# Patient Record
Sex: Male | Born: 1975 | Race: White | Hispanic: No | Marital: Single | Smoking: Former smoker
Health system: Southern US, Community
[De-identification: ages and names within clinical notes are randomized; demographics above are authoritative.]

## PROBLEM LIST (undated history)

## (undated) DIAGNOSIS — I509 Heart failure, unspecified: Secondary | ICD-10-CM

## (undated) DIAGNOSIS — I10 Essential (primary) hypertension: Secondary | ICD-10-CM

## (undated) HISTORY — PX: APPENDECTOMY: SHX54

## (undated) HISTORY — DX: Essential (primary) hypertension: I10

## (undated) HISTORY — DX: Heart failure, unspecified: I50.9

---

## 2006-11-29 ENCOUNTER — Emergency Department (HOSPITAL_COMMUNITY): Admission: EM | Admit: 2006-11-29 | Discharge: 2006-11-29 | Payer: Self-pay | Admitting: Emergency Medicine

## 2008-06-22 ENCOUNTER — Emergency Department (HOSPITAL_COMMUNITY): Admission: EM | Admit: 2008-06-22 | Discharge: 2008-06-22 | Payer: Self-pay | Admitting: Emergency Medicine

## 2008-06-26 ENCOUNTER — Inpatient Hospital Stay (HOSPITAL_COMMUNITY): Admission: EM | Admit: 2008-06-26 | Discharge: 2008-06-29 | Payer: Self-pay | Admitting: Family Medicine

## 2008-06-27 ENCOUNTER — Encounter (INDEPENDENT_AMBULATORY_CARE_PROVIDER_SITE_OTHER): Payer: Self-pay | Admitting: General Surgery

## 2009-09-29 ENCOUNTER — Emergency Department (HOSPITAL_COMMUNITY): Admission: EM | Admit: 2009-09-29 | Discharge: 2009-09-29 | Payer: Self-pay | Admitting: Emergency Medicine

## 2010-11-01 LAB — CBC
MCHC: 35 g/dL (ref 30.0–36.0)
RDW: 12.6 % (ref 11.5–15.5)
WBC: 9 10*3/uL (ref 4.0–10.5)

## 2010-11-01 LAB — COMPREHENSIVE METABOLIC PANEL
ALT: 415 U/L — ABNORMAL HIGH (ref 0–53)
Albumin: 4.1 g/dL (ref 3.5–5.2)
CO2: 26 mEq/L (ref 19–32)
Chloride: 102 mEq/L (ref 96–112)
Creatinine, Ser: 0.97 mg/dL (ref 0.4–1.5)
GFR calc Af Amer: >60 mL/min (ref >60)
Glucose, Bld: 116 mg/dL — ABNORMAL HIGH (ref 70–99)
Potassium: 4.2 mEq/L (ref 3.5–5.1)
Sodium: 135 mEq/L (ref 135–145)
Total Bilirubin: 1.6 mg/dL — ABNORMAL HIGH (ref 0.3–1.2)
Total Protein: 7.5 g/dL (ref 6.0–8.3)

## 2010-11-01 LAB — DIFFERENTIAL
Basophils Relative: 0 % (ref 0–1)
Eosinophils Relative: 0 % (ref 0–5)
Neutro Abs: 6.6 10*3/uL (ref 1.7–7.7)

## 2010-11-01 LAB — LIPASE, BLOOD: Lipase: 28 U/L (ref 11–59)

## 2010-12-25 NOTE — Discharge Summary (Signed)
NAMEBERTIN, INABINET NO.:  [id number]   MEDICAL RECORD NO.:  [id number]          PATIENT TYPE:  INP   LOCATION:  5151                         FACILITY:  [facility]   PHYSICIAN:  Velora Heckler, MD      DATE OF BIRTH:  10/07/1975   DATE OF ADMISSION:  06/26/2008  DATE OF DISCHARGE:  06/29/2008                               DISCHARGE SUMMARY   DISCHARGING PHYSICIAN:  Velora Heckler, MD.   PROCEDURES:  Laparoscopic appendectomy with placement of right lower  quadrant Blake drain by Dr. Lindie Spruce on June 26, 2008.   CONSULTANTS:  There were none.   REASON FOR ADMISSION:  Matthew Warren is a 35 year old white male who  presented to the emergency department with a 7-day history of right  lower quadrant abdominal pain.  At the time of admission, a CT scan was  performed, which showed acute appendicitis.  At this time, his white  blood cell count was 11,400.  At this time, he was taken to the  operating room for surgical intervention.  Please see admitting history  and physical for further details.   ADMITTING DIAGNOSIS:  Acute appendicitis.   HOSPITAL COURSE:  At this time, the patient was admitted and placed on  Unasyn.  He was then taken to the operating room where laparoscopic  appendectomy with placement of the right lower quadrant Blake drain was  performed.  At the time of surgical intervention, it was found that the  patient's appendix had ruptured and at this time was gangrenous.  This  was fairly difficult to remove, however, was removed without  complication, and a Blake drain was placed to help drain any left over  fluid as well as if the patient develops an abscess or a leak at his  appendiceal stump.  The patient tolerated this procedure well.  After  the patient was brought back to the floor, he was then placed on  gentamicin, Flagyl, and Unasyn.  It appears for his antibiotics.  By  postoperative day one-half, the patient was complaining of abdominal  pain mostly with urination, otherwise at this time, he was tolerating  clear liquids.  On exam, his abdomen was soft , diffusely tender with  some active bowel sounds, and his JP drain was draining serosanguineous  fluid.  At this time, he was left on clear liquids and continued on his  gentamicin.  By postoperative day 1, he was doing better and at this  time, his diet was advanced to a full-liquid diet.  Once again on exam,  his abdomen was still diffusely tender, mainly in the right lower  quadrant and his JP still has serosanguineous output.  By postoperative  day 2, the patient was feeling much better as he was placed on Toradol  the day prior.  At this time, he had an appetite and was ready to try a  regular diet.  Also at this time, he was tolerating p.o. pain medicines  with no problem.  On exam, his abdomen was soft, still tender, however,  much less tender.  His incisions were all clean, dry, and  intact, and  his JP drain had decreasing output that was still serosanguineous.  At  this time, his diet was advanced.  His IV antibiotics were discontinued,  and he was started on p.o. Augmentin.  At this time, it was felt that as  long as he tolerated his regular diet, then he may be discharged home  after lunch.   DISCHARGE DIAGNOSES:  1. Gangrenous, ruptured appendicitis.  2. Status post laparoscopic appendectomy with placement of right lower      quadrant Blake drain.   DISCHARGE MEDICATIONS:  1. He does not take any home medications, but he was given      prescription for Augmentin 875 mg one p.o. b.i.d. for 7 days.  2. Vicodin 5/325 one to two tablets q.4 h. p.r.n. pain.  3. He was also advised to take any over-the-counter stool softener as      needed for constipation.   DISCHARGE INSTRUCTIONS:  Matthew Warren was informed that he may not  return to work for at least 2 weeks.  He does not have any dietary  restrictions and he may increase his activity slowly and he may walk  up  steps.  He was informed that currently while he has his JP drain in  place, he is not to shower or bath.  He was also not to lift anything  greater than approximately 15 pounds for the next 2 weeks.  As far as  his JP drain goes, I have written an order for the nurses to teach him  how to empty and recharge this, and he is to be given a recording sheet  for he can record his drain output and bringing this with him to his  followup appointment.  Otherwise, he was informed that if his fever  increases to over 101.5, if he has worsening abdominal pain, redness, or  pus-like drainage from his incisions or stool or pus-like drainage out  of his JP drain, he is to follow our office prior to his appointment.  Otherwise, he is to follow up with the [facility]  Surgery office on July 05, 2008, at 2:30 for JP drain removal.  After this appointment, he will then be informed when he is to set up  his next appointment for his last postoperative followup visit.      Matthew Cape, PA      Velora Heckler, MD  Electronically Signed    KEO/MEDQ  D:  06/29/2008  T:  06/29/2008  Job:  [id number]   cc:   Cherylynn Ridges, M.D.

## 2010-12-25 NOTE — Op Note (Signed)
NAMEJEOFFREY, ELEAZER            ACCOUNT NO.:  [id number]   MEDICAL RECORD NO.:  [id number]          PATIENT TYPE:  INP   LOCATION:  5151                         FACILITY:  [facility]   PHYSICIAN:  Cherylynn Ridges, M.D.    DATE OF BIRTH:  10-20-1975   DATE OF PROCEDURE:  DATE OF DISCHARGE:                               OPERATIVE REPORT   PREOPERATIVE DIAGNOSIS:  Acute appendicitis.   POSTOPERATIVE DIAGNOSIS:  Acute gangrenous appendicitis with rupture.   PROCEDURE:  Laparoscopic appendectomy with placement of right lower  quadrant Blake drain.   SURGEON:  Cherylynn Ridges, MD   ANESTHESIA:  General endotracheal.   ESTIMATED BLOOD LOSS:  Less than 100 mL.   COMPLICATIONS:  Ruptured appendix.   CONDITION:  Stable.   INDICATIONS FOR OPERATION:  The patient is a 35 year old who has had  abdominal pain for over nearly a week, localizing now to the right lower  quadrant who comes in now for laparoscopic appendectomy for appendicitis  demonstrated on CT.   FINDINGS:  The gangrenous was completely dead and gangrenous.  There was  a 2-cm space near the base of the cecum that was viable and not  gangrenous where we placed a staple line.   OPERATION:  The patient was taken to the operating room, placed on the  table in supine position.  After an adequate general endotracheal  anesthetic was administered, it was prepped and draped in usual sterile  manner exposing the midline and the full abdomen.   As we palpated the abdomen prior to making our incision, there was a  mass in the right lower quadrant.  A supraumbilical curvilinear incision  was made using #11 blade and taken down to the midline fascia.  We  grabbed the fascia with 2 Kocher clamps and made an incision between the  clamps using a #15 blade into the preperitoneal space.  We then  dissected down bluntly into the peritoneal cavity using a Kelly clamp as  we pulled up on the abdominal cavity with the Kocher clamp.  As we did  so, we got into a free space.  A pursestring suture of 0 Vicryl was  passed which held an Hasson cannula which was subsequently passed into  the peritoneal cavity.  Once this was done, right upper quadrant 5 mm  cannula was passed and a left lower quadrant 11 mm cannula was passed  under direct vision.  After all cannulas were in place, the patient was  placed in Trendelenburg and left side was tilted down.   There was a lot of small bowel adhesions to the gangrenous appendix  which was in the right lower quadrant.  We able to mobilize these  bluntly away from the ruptured appendix where some small amount of pus,  but a deeply gangrenous appendix was noted.  We followed it all the way  down to the base where it attached to the cecum where there was a 2-cm  window of normal-looking appendiceal mucosa.  We were able to mobilize  this away from the mesoappendix and subsequently passed an Endo-GIA 3.5-  mm closure across  at base leaving viable tissue at the base of the  cecum.  We used a 2.5-mm Endo-GIA, actually used two of them, across the  base of the mesoappendix and subsequently removed the appendix from the  left lower quadrant drain site.   We removed the appendix using an EndoCatch bag.  We irrigated with about  4 L of saline solution in the right lower quadrant primarily and also in  the pelvis and above the liver.  We placed a 90-mm Blake drain in the  right lower quadrant and coursing down through the pelvis also bring out  in the left lower quadrant incision and suturing it with 2-0 nylon.  We  aspirated all fluid and gas and closed.   The fascia at the supraumbilical site was closed using the pursestring  suture which was in place.  Marcaine 0.25% with epi was injected in all  sites and the skin at the supraumbilical site was closed using running  subcuticular stitch of 4-0 Monocryl.  Dermabond, Steri-Strips, and  Tegaderm were applied to that incision and also to the right  upper  quadrant incision were Dermabond was primary closure.  The left lower  quadrant drain site was covered with antibiotic ointment and povidone  and also 2x2 gauze.  All counts were correct.      Cherylynn Ridges, M.D.  Electronically Signed     JOW/MEDQ  D:  06/26/2008  T:  06/27/2008  Job:  [id number]

## 2011-04-27 ENCOUNTER — Inpatient Hospital Stay (INDEPENDENT_AMBULATORY_CARE_PROVIDER_SITE_OTHER)
Admission: RE | Admit: 2011-04-27 | Discharge: 2011-04-27 | Disposition: A | Discharge status: Home / Self Care | Payer: BC Managed Care – PPO | Source: Ambulatory Visit | Attending: Emergency Medicine | Admitting: Emergency Medicine

## 2011-04-27 ENCOUNTER — Encounter: Payer: Self-pay | Admitting: Emergency Medicine

## 2011-04-27 DIAGNOSIS — B009 Herpesviral infection, unspecified: Secondary | ICD-10-CM | POA: Insufficient documentation

## 2011-04-27 DIAGNOSIS — J069 Acute upper respiratory infection, unspecified: Secondary | ICD-10-CM | POA: Insufficient documentation

## 2011-05-14 LAB — POCT URINALYSIS DIP (DEVICE)
Nitrite: NEGATIVE
Operator id: 235561
Protein, ur: 30 — AB
Urobilinogen, UA: 1
pH: 6

## 2011-05-14 LAB — DIFFERENTIAL
Eosinophils Absolute: 0.3
Eosinophils Relative: 3
Lymphs Abs: 2.3
Monocytes Absolute: 1.1 — ABNORMAL HIGH
Monocytes Relative: 9
Neutrophils Relative %: 68

## 2011-05-14 LAB — COMPREHENSIVE METABOLIC PANEL
BUN: 11
CO2: 28
Creatinine, Ser: 0.94
Potassium: 3.5
Total Bilirubin: 0.6
Total Protein: 7

## 2011-05-14 LAB — CBC
HCT: 44.8
Hemoglobin: 15.1
MCHC: 33.8
MCV: 90.1
Platelets: 228

## 2011-05-14 LAB — LIPASE, BLOOD: Lipase: 15

## 2011-05-14 LAB — GENTAMICIN LEVEL, RANDOM: Gentamicin Rm: 1.6

## 2011-07-15 NOTE — Progress Notes (Signed)
Summary: COLD SORES/CHEST CONGESTION (room4)   Vital Signs:  Patient Profile:   35 Years Old Male CC:      URI x 7 days Height:     70 inches Weight:      175 pounds O2 treatment:    Room Air Temp:     97.7 degrees F oral Pulse rate:   82 / minute Resp:     16 per minute BP sitting:   132 / 88  (left arm) Cuff size:   regular  Vitals Entered By: Lavell Islam RN (April 27, 2011 12:27 PM)                  Prior Medication List:  No prior medications documented  Updated Prior Medication List: No Medications Current Allergies: No known allergies History of Present Illness Chief Complaint: URI x 7 days History of Present Illness: 57) 35 Years Old Male complains of onset of cold symptoms for a few days.  Matthew Warren has been using nothing OTC. No sore throat + cough No pleuritic pain No wheezing + nasal congestion + post-nasal drainage No sinus pain/pressure +chest congestion No itchy/red eyes No earache No hemoptysis No SOB No chills/sweats No fever No nausea No vomiting No abdominal pain No diarrhea + skin rashes (upper lip swelling, which is a recurrent problem 1-2 times per year) No fatigue No myalgias No headache   REVIEW OF SYSTEMS Constitutional Symptoms      Denies fever, chills, night sweats, weight loss, weight gain, and fatigue.  Eyes       Denies change in vision, eye pain, eye discharge, glasses, contact lenses, and eye surgery. Ear/Nose/Throat/Mouth       Complains of sore throat and hoarseness.      Denies hearing loss/aids, change in hearing, ear pain, ear discharge, dizziness, frequent runny nose, frequent nose bleeds, sinus problems, and tooth pain or bleeding.  Respiratory       Complains of productive cough and shortness of breath.      Denies dry cough, wheezing, asthma, bronchitis, and emphysema/COPD.  Cardiovascular       Denies murmurs, chest pain, and tires easily with exhertion.    Gastrointestinal       Denies stomach pain,  nausea/vomiting, diarrhea, constipation, blood in bowel movements, and indigestion. Genitourniary       Denies painful urination, kidney stones, and loss of urinary control. Neurological       Denies paralysis, seizures, and fainting/blackouts. Musculoskeletal       Denies muscle pain, joint pain, joint stiffness, decreased range of motion, redness, swelling, muscle weakness, and gout.  Skin       Denies bruising, unusual mles/lumps or sores, and hair/skin or nail changes.  Psych       Denies mood changes, temper/anger issues, anxiety/stress, speech problems, depression, and sleep problems. Other Comments: URI x 1 week; fever blisters   Past History:  Past Surgical History: Denies surgical history  Family History: unremarkable  Social History: married Never Smoked Alcohol use-yes Drug use-no Smoking Status:  never Drug Use:  no Physical Exam General appearance: well developed, well nourished, no acute distress Ears: normal, no lesions or deformities Nasal: mucosa pink, nonedematous, no septal deviation, turbinates normal Oral/Pharynx: tongue normal, posterior pharynx without erythema or exudate Heart: regular rate and  rhythm, no murmur Abdomen: soft, non-tender without obvious organomegaly Skin: he has irritation of his L upper lip with crusting c/w oral herpes MSE: oriented to time, place, and person Assessment New Problems:  RESPIRATORY DISORDER, ACUTE (ICD-465.9) 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  Treat  with cheratussini for the cough, hydration, rest, can take Claritin-D for congestion, motrin, tylenol. Follow-up with your primary care physician if not improving or if getting worse   For the oral herpes, I gave him a Rx for valtrex (enough for 2 flares) and he also requested something to numb it, so will try lidocaine gel for relief.  If this is a recurrant problem, I'd like him to get set up with a PCP.   The patient and/or caregiver has been counseled thoroughly with regard to medications prescribed including dosage, schedule, interactions, rationale for use, and possible side effects and they verbalize understanding.  Diagnoses and expected course of recovery discussed and will return if not improved as expected or if the condition worsens. Patient and/or caregiver verbalized understanding.  Prescriptions: CHERATUSSIN AC 100-10 MG/5ML SYRP (GUAIFENESIN-CODEINE) 5cc q6 hrs as needed for cough  #5oz x 0   Entered and Authorized by:   Hoyt Koch MD   Signed by:   Hoyt Koch MD on 04/27/2011   Method used:   Print then Give to Patient   RxID:   [id number] LIDOCAINE HCL 2 % GEL (LIDOCAINE HCL) apply to affected area as needed  #QS x1 wk x 0   Entered and Authorized by:   Hoyt Koch MD   Signed by:   Hoyt Koch MD on 04/27/2011   Method used:   Print then Give to Patient   RxID:   [id number] VALTREX 1 GM TABS (VALACYCLOVIR HCL) 2 tabs two times a day for 1 day  #8 x 0   Entered and Authorized by:   Hoyt Koch MD   Signed by:   Hoyt Koch MD on 04/27/2011   Method used:   Print then Give to Patient   RxID:   [id number]   Orders Added: 1)  New Patient Level III [25366]

## 2011-10-12 IMAGING — CR DG ABDOMEN ACUTE W/ 1V CHEST
3 series · 3 of 3 positions shown · non-contrast
Comparison: CT abdomen pelvis 06/26/2008

CLINICAL DATA: Chest pain, abdominal pain, shortness of breath.

ACUTE ABDOMEN SERIES (ABDOMEN 2 VIEW & CHEST 1 VIEW)

[w chest pa]
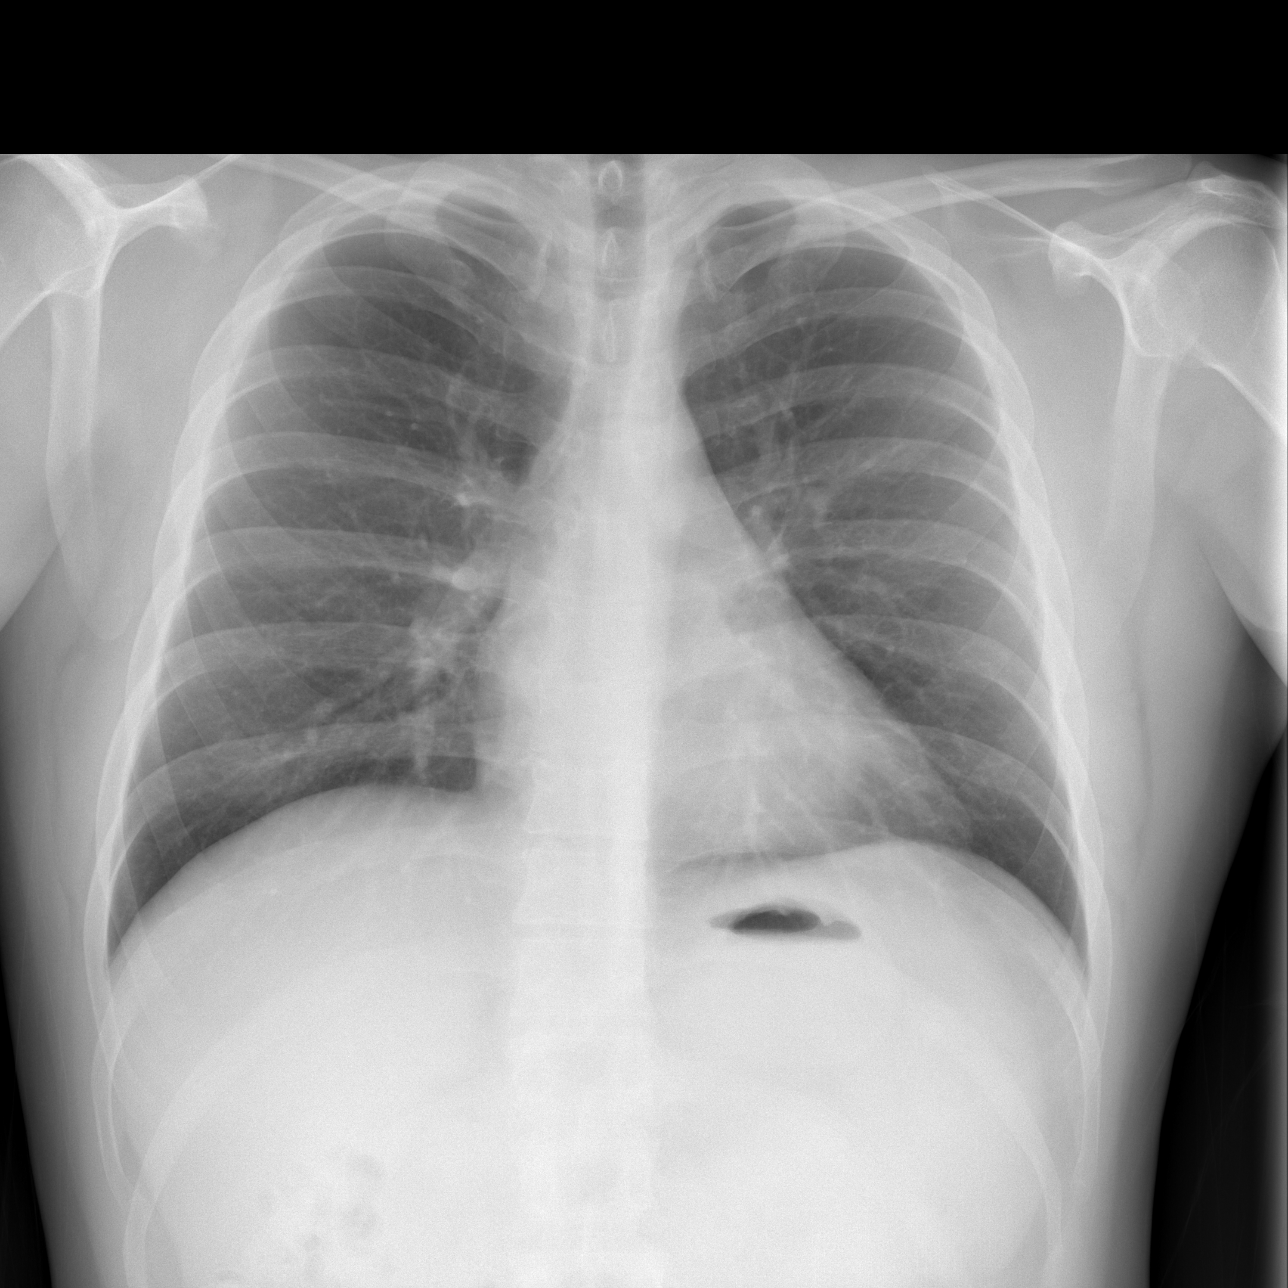

[w abdomen upright *]
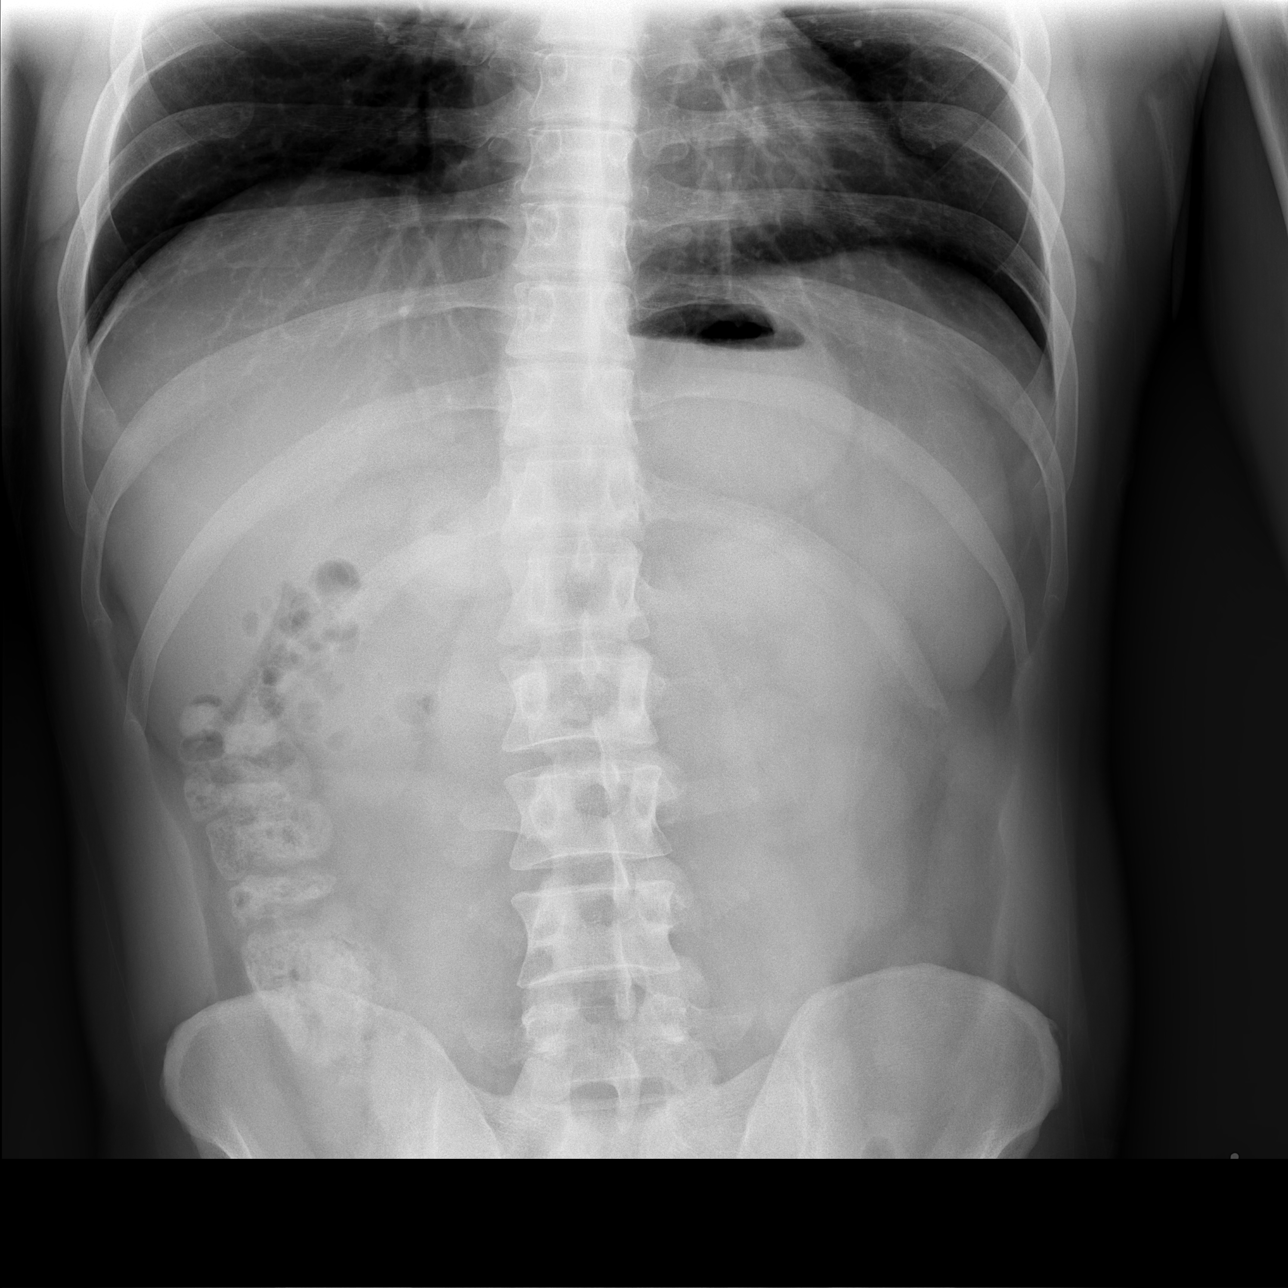

[t abdomen supine]
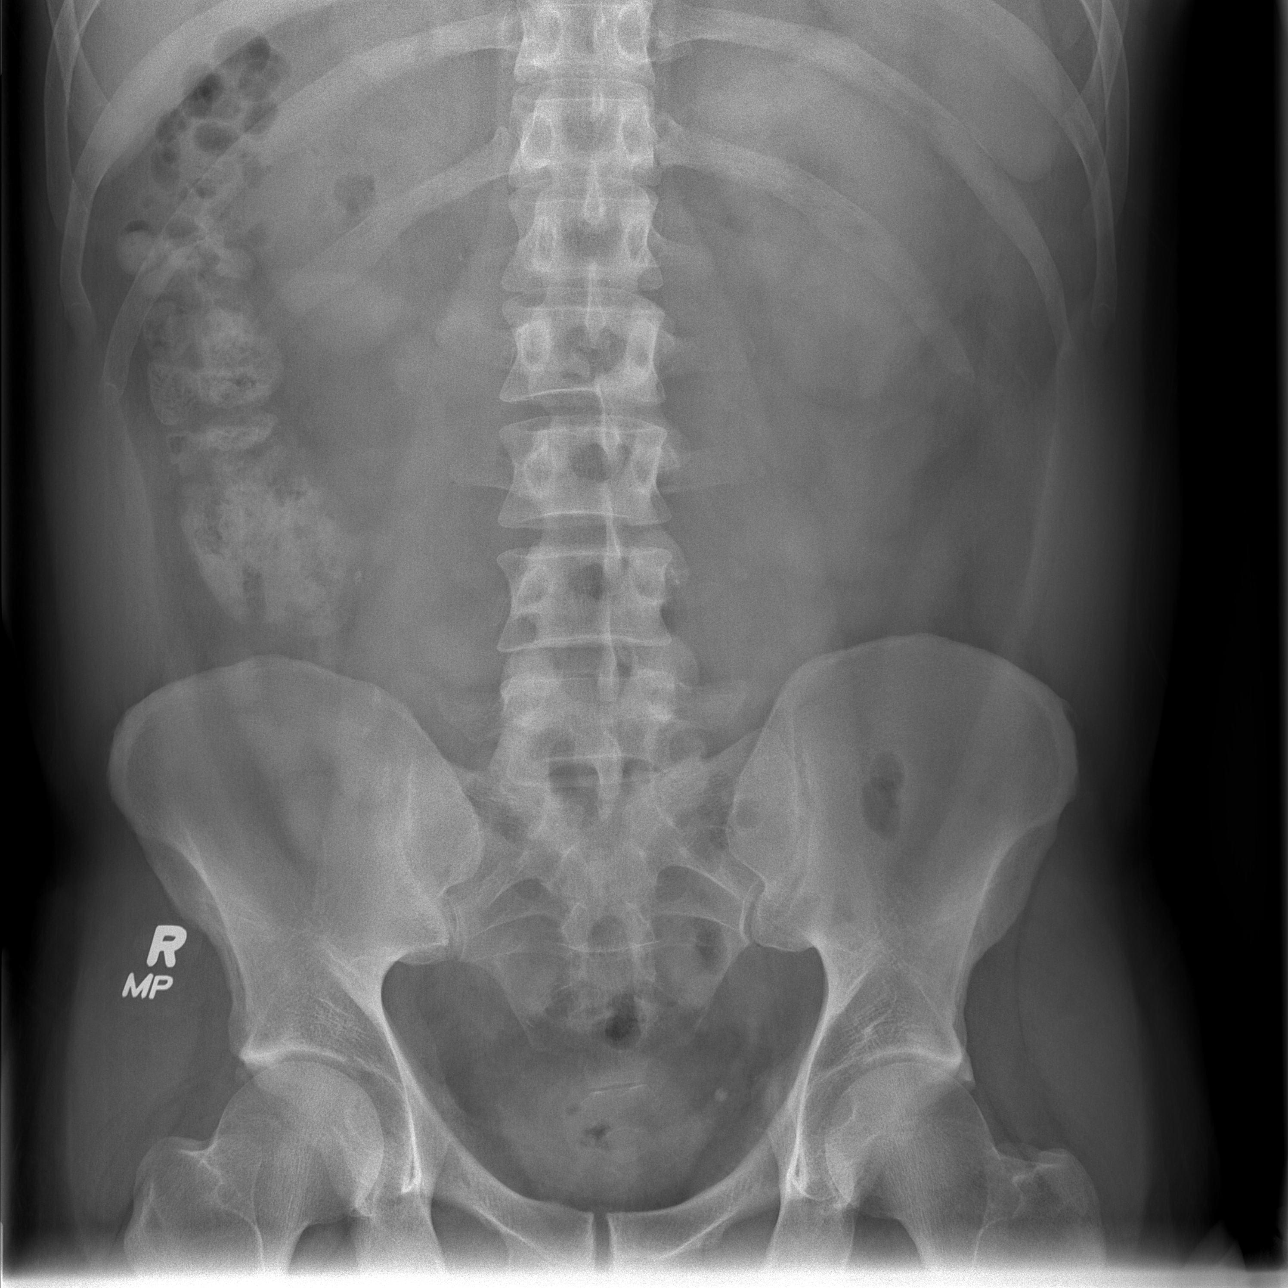

[3 of 3 positions shown; findings below may reference images not displayed]

FINDINGS: Frontal view of the chest shows midline trachea and
normal heart size.  Lungs are clear.

Two views of the abdomen shows stool and retained contrast in the
cecum and ascending colon.  There is a relative paucity of small
bowel gas.  Minimal gas is seen in the rectosigmoid colon.
IMPRESSION: Paucity of small bowel gas.  Bowel gas pattern is otherwise
unremarkable.

## 2011-10-12 IMAGING — US US ABDOMEN COMPLETE
1 series · 14 of 25 positions shown · non-contrast
Comparison: 06/26/2008

CLINICAL DATA: Abdominal pain, prior appendectomy

COMPLETE ABDOMINAL ULTRASOUND

[Series 1: us abdomen complete · 0.33mm/px · 14 of 68 slices shown]
[im 1/68]
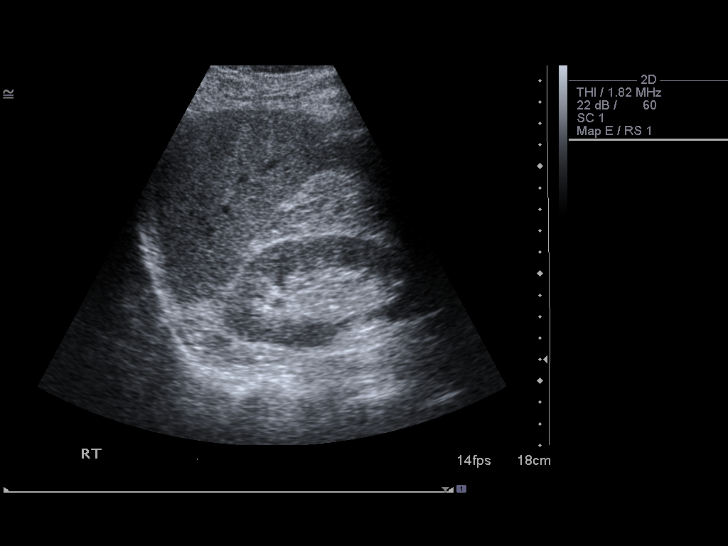
[im 6/68]
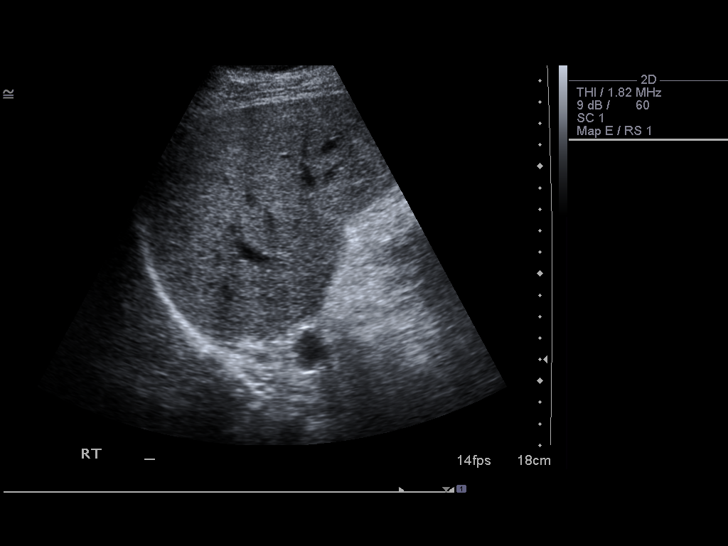
[im 12/68]
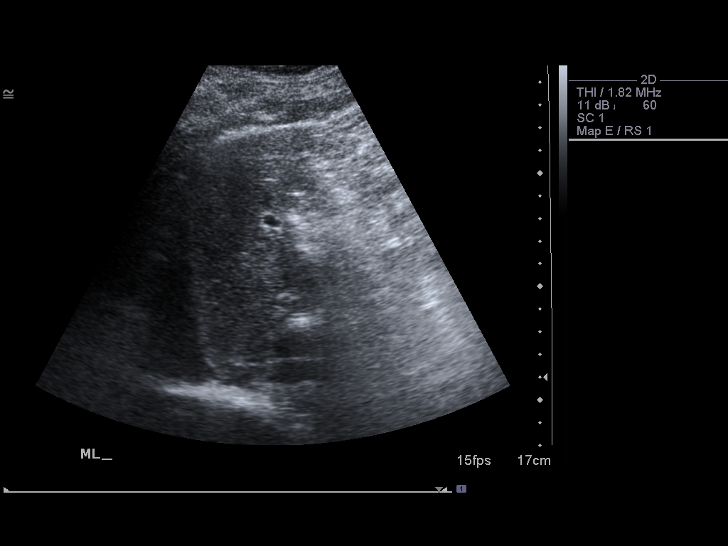
[im 17/68]
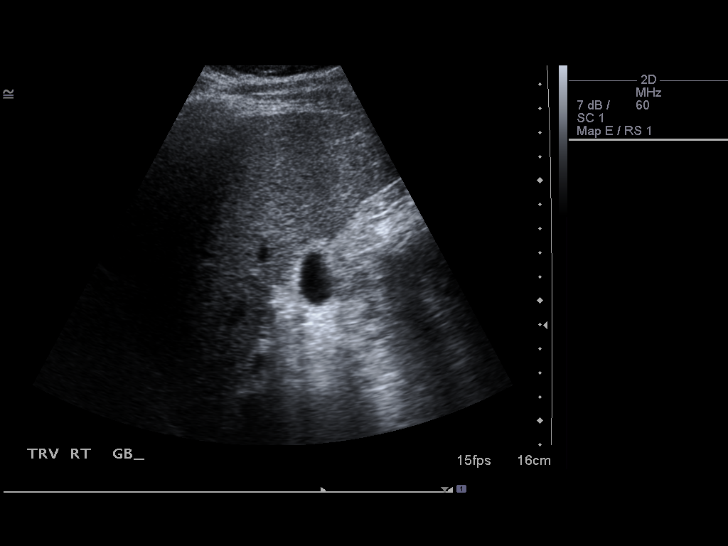
[im 23/68]
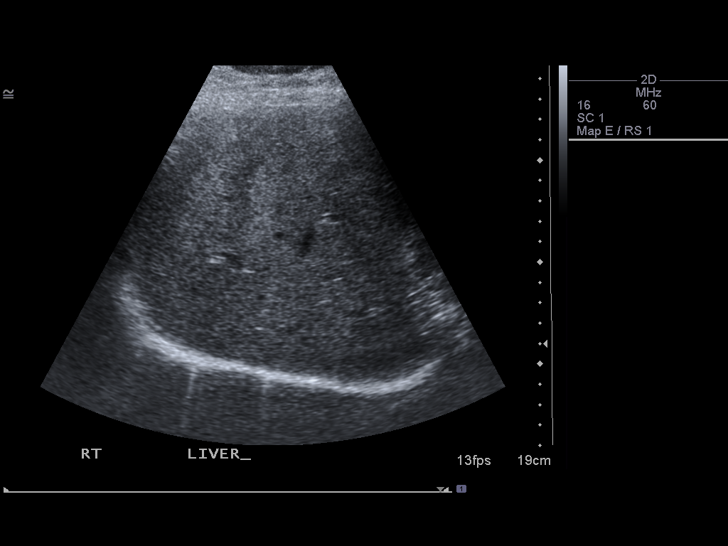
[im 26/68]
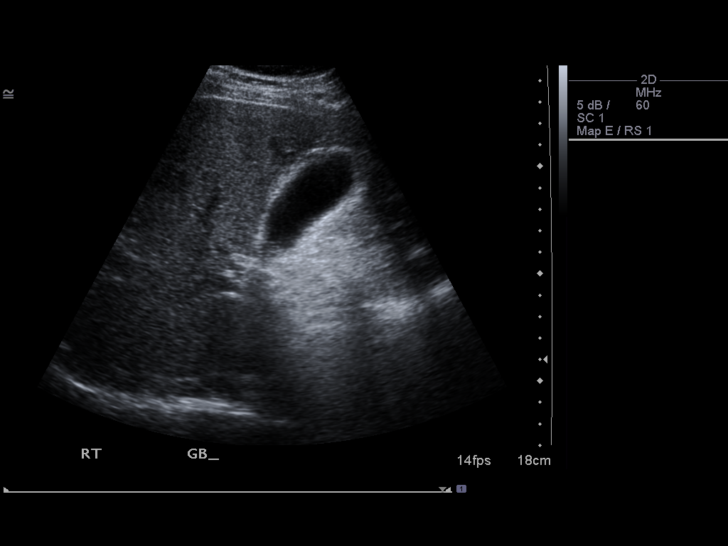
[im 31/68]
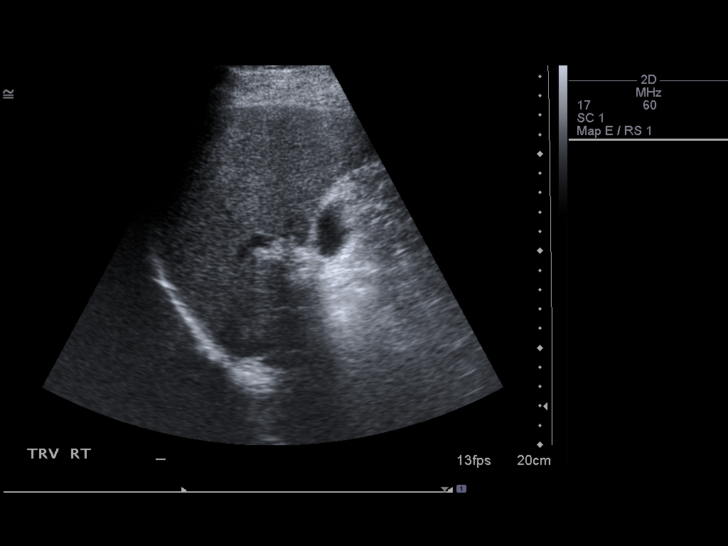
[im 37/68]
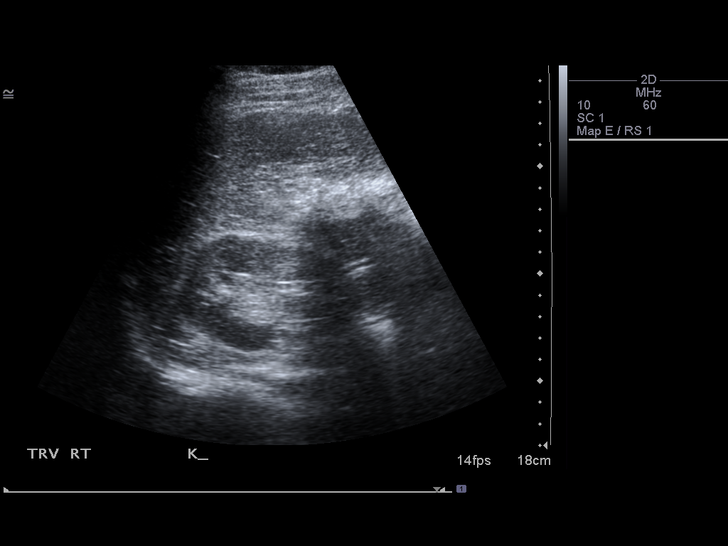
[im 42/68]
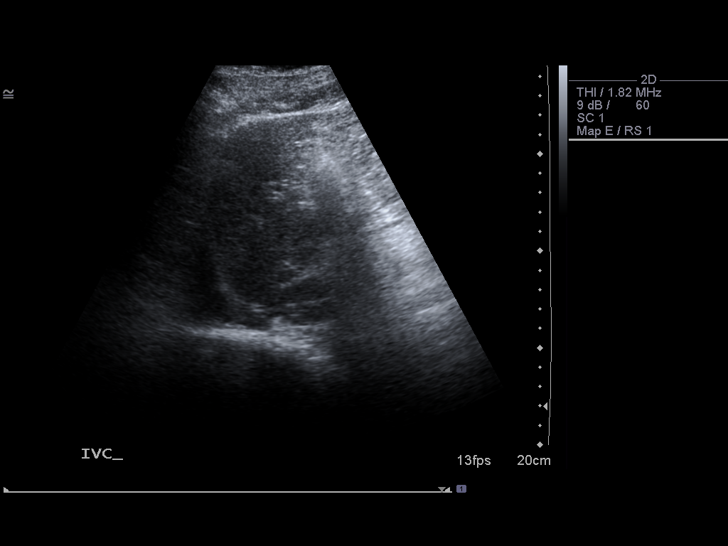
[im 45/68]
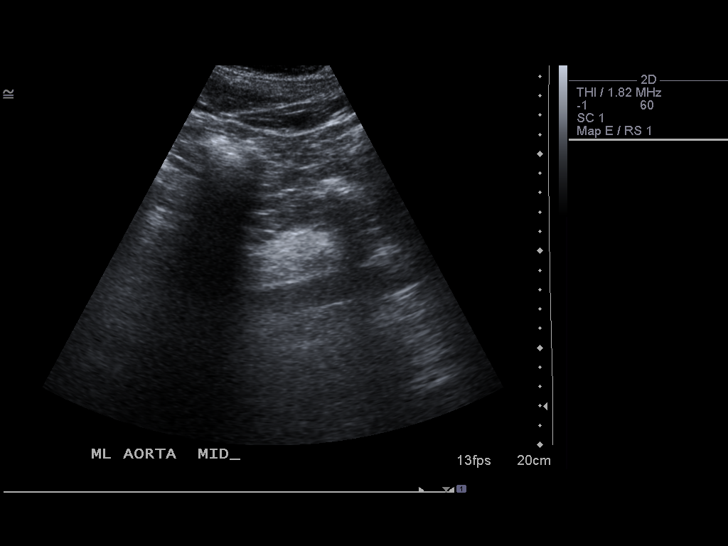
[im 51/68]
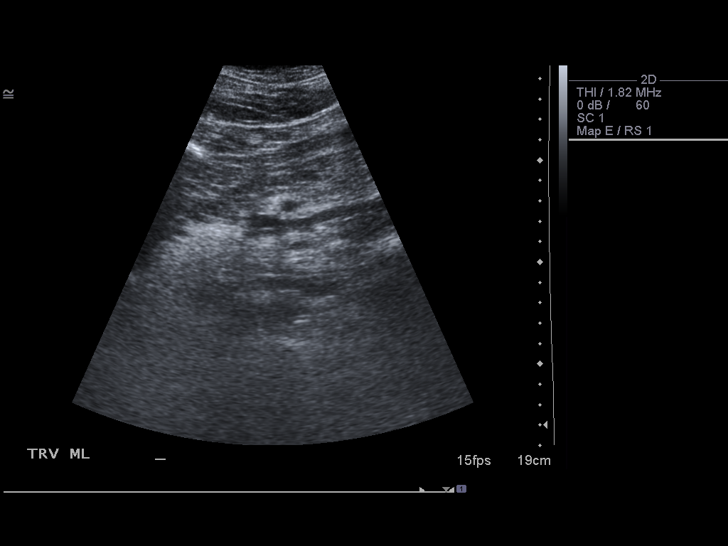
[im 56/68]
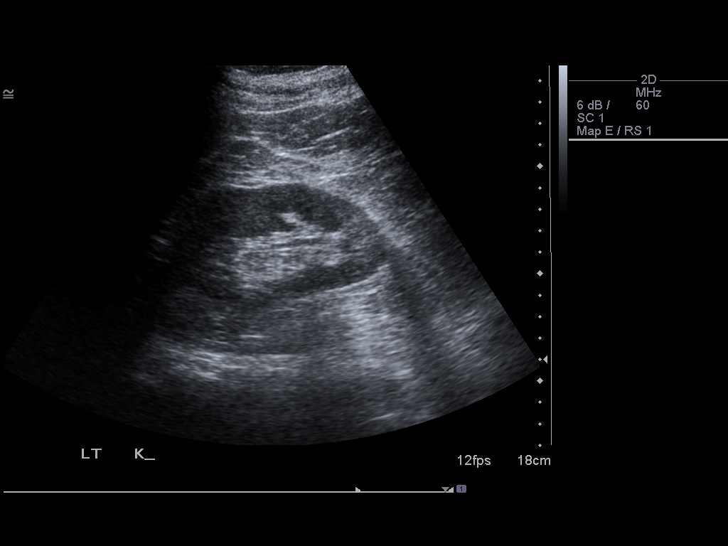
[im 62/68]
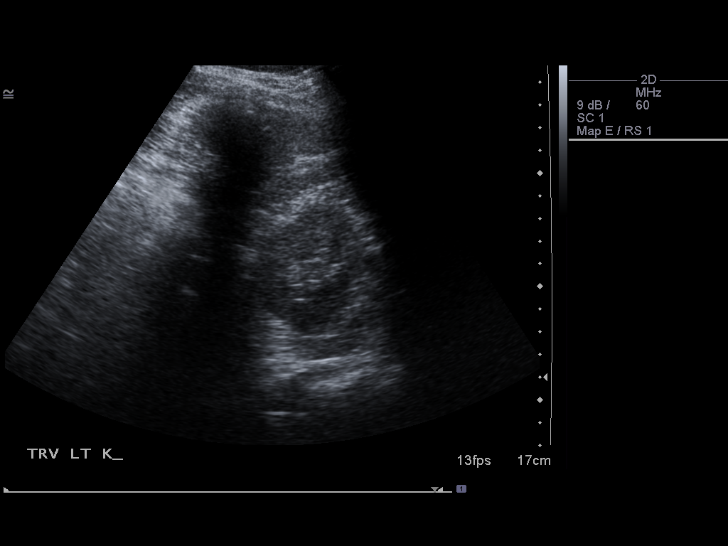
[im 68/68]
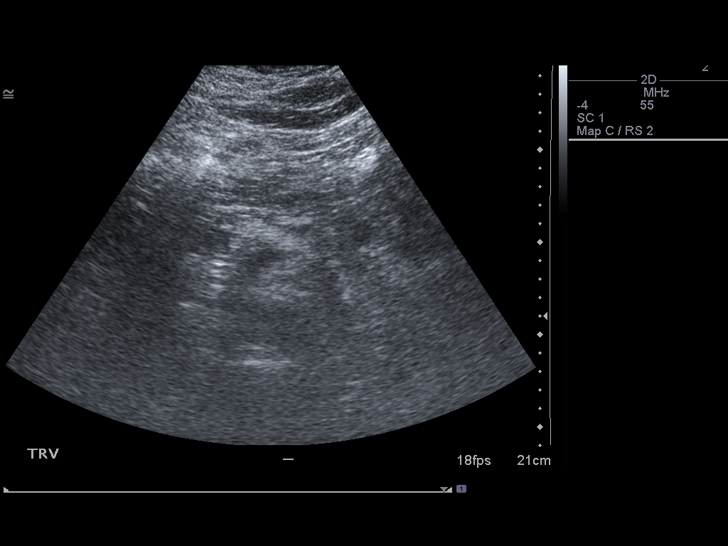

[14 of 25 positions shown; findings below may reference images not displayed]

FINDINGS: Gallbladder:  No gallstones, gallbladder wall thickening, or
pericholecystic fluid.

Common bile duct:  Measures 3.2 mm.  No obstruction or dilatation.

Liver:  No focal lesion identified.  Within normal limits in
parenchymal echogenicity.

IVC:  Appears normal.

Pancreas:  Obscured by bowel gas.  Not visualized.

Spleen:  10.2 cm length.  No focal abnormality.  Normal size.

Right Kidney:  12.4 cm length.  Normal cortex and echogenicity.  No
hydronephrosis.

Left Kidney:  12.4 cm length.  Normal cortex and echogenicity.  No
hydronephrosis.

Abdominal aorta:  No aneurysm identified.
IMPRESSION: Negative abdominal ultrasound.
Nonvisualization of the pancreas

## 2020-10-31 ENCOUNTER — Ambulatory Visit (INDEPENDENT_AMBULATORY_CARE_PROVIDER_SITE_OTHER): Payer: No Payment, Other | Admitting: Clinical

## 2020-10-31 ENCOUNTER — Other Ambulatory Visit: Payer: Self-pay

## 2020-10-31 DIAGNOSIS — F322 Major depressive disorder, single episode, severe without psychotic features: Secondary | ICD-10-CM | POA: Diagnosis not present

## 2020-11-04 DIAGNOSIS — F322 Major depressive disorder, single episode, severe without psychotic features: Secondary | ICD-10-CM | POA: Insufficient documentation

## 2020-11-04 NOTE — Progress Notes (Signed)
Comprehensive Clinical Assessment (CCA) Note  10/31/2020 Matthew Warren [id number]  Chief Complaint:  Chief Complaint  Patient presents with  . Depression   Visit Diagnosis:  Major depressive disorder,single episode, severe   Interpretive Summary:   Client is a 45 year old male presenting to [facility] outpatient for behavioral health services. Client presents by referral of family and friends for a clinical assessment. Client presents with the chief complaint of depression. Client reports onset of symptoms occurring in January 2022 when he and his girlfriend split after one year of dating. Client reported during that time things weren't going well at work which caused him finically difficulty and negatively impacted the relationship which he believes led to his girlfriend breaking up with him. Client reported he has been pacing around his house thinking about the relationship and "what he did or what he could've done". Client reported he shares custody of his son with his wife but has not spent much time with his son because he has not felt stable to spend time with him yet. Client reported his family is supportive. Client endorses the following symptoms of depressed mood, difficulty concentrating, insomnia, tearfulness, and poor appetite. Client reports no substance use history. Client reported no prior history of mental health treatments. Client was screened for the following SDOH:  Advertising copywriter from 10/31/2020 in [facility]  PHQ-9 Total Score 20     Flowsheet Row Counselor from 10/31/2020 in [facility]  PHQ-2 Total Score 5      Treatment recommendations: psychiatric evaluation with medication management and individual therapy  Therapist provided information on format of appointment (virtual or face to face).  The client was advised to call back or seek an in-person evaluation if the symptoms worsen or if the condition  fails to improve as anticipated before the next scheduled appointment. Client was in agreement with treatment recommendations.    CCA Biopsychosocial Intake/Chief Complaint:  Client reported he has had depressive symptoms since January 2022 following a break up with his girlfriend. Client reported no prior history of mental health treatment prior to this year.  Current Symptoms/Problems: depressed mood, passive suicidal ideation with plan and/ or intent, insomnia, rumination, poor appetite, difficulty concentrating   Patient Reported Schizophrenia/Schizoaffective Diagnosis in Past: No   Type of Services Patient Feels are Needed: Psychiatric evaluation, medication management, individual therapy   Initial Clinical Notes/Concerns: No data recorded  Mental Health Symptoms Depression:  Change in energy/activity; Difficulty Concentrating; Hopelessness; Tearfulness; Sleep (too much or little); Increase/decrease in appetite; Worthlessness   Duration of Depressive symptoms: Greater than two weeks   Mania:  None   Anxiety:   Restlessness   Psychosis:  None   Duration of Psychotic symptoms: No data recorded  Trauma:  None   Obsessions:  None   Compulsions:  None   Inattention:  None   Hyperactivity/Impulsivity:  N/A   Oppositional/Defiant Behaviors:  None   Emotional Irregularity:  None   Other Mood/Personality Symptoms:  No data recorded   Mental Status Exam Appearance and self-care  Stature:  Average   Weight:  Average weight   Clothing:  Casual   Grooming:  Normal   Cosmetic use:  Age appropriate   Posture/gait:  Normal   Motor activity:  Not Remarkable   Sensorium  Attention:  Normal   Concentration:  Normal   Orientation:  X5   Recall/memory:  Normal   Affect and Mood  Affect:  Anxious   Mood:  Depressed  Relating  Eye contact:  Normal   Facial expression:  Responsive   Attitude toward examiner:  Cooperative   Thought and Language   Speech flow: Clear and Coherent   Thought content:  Appropriate to Mood and Circumstances   Preoccupation:  None   Hallucinations:  None   Organization:  No data recorded  [employer] of Knowledge:  Good   Intelligence:  Average   Abstraction:  Normal   Judgement:  Good   Reality Testing:  Adequate   Insight:  Good   Decision Making:  Normal   Social Functioning  Social Maturity:  Responsible; Isolates   Social Judgement:  Normal   Stress  Stressors:  Relationship   Coping Ability:  Resilient   Skill Deficits:  Self-care; Activities of daily living   Supports:  Family     Religion: Religion/Spirituality Are You A Religious Person?: No  Leisure/Recreation: Leisure / Recreation Do You Have Hobbies?: Yes  Exercise/Diet: Exercise/Diet Do You Exercise?: No Have You Gained or Lost A Significant Amount of Weight in the Past Six Months?: No Do You Follow a Special Diet?: No Do You Have Any Trouble Sleeping?: Yes Explanation of Sleeping Difficulties: Client reported he takes Tylenol PM every night most nights to help sleep.   CCA Employment/Education Employment/Work Situation: Employment / Work Situation Employment situation: Employed Where is patient currently employed?: [employer] How long has patient been employed?: over a year Patient's job has been impacted by current illness: Yes Describe how patient's job has been impacted: Client reported he has had to take time off of work.  Education: Education Did Garment/textile technologist From [employer]?: Yes (GED) Did You Attend College?: Yes What Type of College Degree Do you Have?: Client reported he did three years of college.   CCA Family/Childhood History Family and Relationship History: Family history Marital status: Single Does patient have children?: Yes How many children?: 1 How is patient's relationship with their children?: Client reported he has a good relationship with his 66 year  old son whom he shares custody with his ex wife.  Childhood History:  Childhood History By whom was/is the patient raised?: Mother,Mother/father and step-parent Additional childhood history information: Client reported he is from [place]. Client reported his childhood was mostly happy memories. Client reported his parents divorced when he was 33 years old and his dad drifted far away. Client reported it wasn't until his 20's that he realized it wasn't his fault because his father wasn't around due to his father's drug issues. Client reported currently his mother mental health has changed her demeanor and has a hard time coping with it. Client reported his father passed away 10-09-20. Does patient have siblings?: Yes Number of Siblings: 3 Description of patient's current relationship with siblings: Cient reported he has a twin brother and two sisters whom he has good relationships with. Did patient suffer any verbal/emotional/physical/sexual abuse as a child?: No Did patient suffer from severe childhood neglect?: No Has patient ever been sexually abused/assaulted/raped as an adolescent or adult?: No Was the patient ever a victim of a crime or a disaster?: No Witnessed domestic violence?: No Has patient been affected by domestic violence as an adult?: No  Child/Adolescent Assessment:     CCA Substance Use Alcohol/Drug Use: Alcohol / Drug Use History of alcohol / drug use?: No history of alcohol / drug abuse  ASAM's:  Six Dimensions of Multidimensional Assessment  Dimension 1:  Acute Intoxication and/or Withdrawal Potential:      Dimension 2:  Biomedical Conditions and Complications:      Dimension 3:  Emotional, Behavioral, or Cognitive Conditions and Complications:     Dimension 4:  Readiness to Change:     Dimension 5:  Relapse, Continued use, or Continued Problem Potential:     Dimension 6:  Recovery/Living Environment:     ASAM Severity  Score:    ASAM Recommended Level of Treatment:     Substance use Disorder (SUD)    Recommendations for Services/Supports/Treatments: Recommendations for Services/Supports/Treatments Recommendations For Services/Supports/Treatments: Medication Management,Individual Therapy  DSM5 Diagnoses: Patient Active Problem List   Diagnosis Date Noted  . 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 RESPIRATORY DISORDER, ACUTE 04/27/2011    Patient Centered Plan: Patient is on the following Treatment Plan(s):  Depression   Referrals to Alternative Service(s): Referred to Alternative Service(s):   Place:   Date:   Time:    Referred to Alternative Service(s):   Place:   Date:   Time:    Referred to Alternative Service(s):   Place:   Date:   Time:    Referred to Alternative Service(s):   Place:   Date:   Time:     Loree Fee, LCSW

## 2020-11-24 ENCOUNTER — Other Ambulatory Visit: Payer: Self-pay

## 2020-11-24 ENCOUNTER — Telehealth (HOSPITAL_COMMUNITY): Payer: No Payment, Other | Admitting: Psychiatry

## 2020-11-28 ENCOUNTER — Ambulatory Visit (HOSPITAL_COMMUNITY): Payer: No Payment, Other | Admitting: Clinical

## 2020-12-15 ENCOUNTER — Ambulatory Visit (HOSPITAL_COMMUNITY): Payer: No Payment, Other | Admitting: Clinical

## 2022-05-31 ENCOUNTER — Other Ambulatory Visit: Payer: Self-pay

## 2022-05-31 ENCOUNTER — Emergency Department (HOSPITAL_COMMUNITY)
Admission: EM | Admit: 2022-05-31 | Discharge: 2022-05-31 | Discharge status: Against Medical Advice | Payer: Self-pay | Attending: Emergency Medicine | Admitting: Emergency Medicine

## 2022-05-31 ENCOUNTER — Encounter (HOSPITAL_COMMUNITY): Payer: Self-pay

## 2022-05-31 DIAGNOSIS — R0981 Nasal congestion: Secondary | ICD-10-CM | POA: Insufficient documentation

## 2022-05-31 DIAGNOSIS — R0602 Shortness of breath: Secondary | ICD-10-CM | POA: Insufficient documentation

## 2022-05-31 DIAGNOSIS — N189 Chronic kidney disease, unspecified: Secondary | ICD-10-CM | POA: Insufficient documentation

## 2022-05-31 DIAGNOSIS — F17200 Nicotine dependence, unspecified, uncomplicated: Secondary | ICD-10-CM | POA: Insufficient documentation

## 2022-05-31 DIAGNOSIS — I129 Hypertensive chronic kidney disease with stage 1 through stage 4 chronic kidney disease, or unspecified chronic kidney disease: Secondary | ICD-10-CM | POA: Insufficient documentation

## 2022-05-31 DIAGNOSIS — R059 Cough, unspecified: Secondary | ICD-10-CM | POA: Insufficient documentation

## 2022-05-31 DIAGNOSIS — Z5321 Procedure and treatment not carried out due to patient leaving prior to being seen by health care provider: Secondary | ICD-10-CM | POA: Insufficient documentation

## 2022-05-31 LAB — COMPREHENSIVE METABOLIC PANEL
ALT: 47 U/L — ABNORMAL HIGH (ref 0–44)
AST: 30 U/L (ref 15–41)
Albumin: 3.9 g/dL (ref 3.5–5.0)
Alkaline Phosphatase: 95 U/L (ref 38–126)
Anion gap: 6 (ref 5–15)
BUN: 22 mg/dL — ABNORMAL HIGH (ref 6–20)
CO2: 25 mmol/L (ref 22–32)
Calcium: 8.8 mg/dL — ABNORMAL LOW (ref 8.9–10.3)
Chloride: 105 mmol/L (ref 98–111)
Creatinine, Ser: 1.33 mg/dL — ABNORMAL HIGH (ref 0.61–1.24)
GFR, Estimated: >60 mL/min (ref >60)
Glucose, Bld: 137 mg/dL — ABNORMAL HIGH (ref 70–99)
Potassium: 3.8 mmol/L (ref 3.5–5.1)
Sodium: 136 mmol/L (ref 135–145)
Total Bilirubin: 0.7 mg/dL (ref 0.3–1.2)
Total Protein: 7.5 g/dL (ref 6.5–8.1)

## 2022-05-31 LAB — CBC WITH DIFFERENTIAL/PLATELET
Abs Immature Granulocytes: 0.03 10*3/uL (ref 0.00–0.07)
Basophils Absolute: 0.1 10*3/uL (ref 0.0–0.1)
Basophils Relative: 1 %
Eosinophils Absolute: 0.2 10*3/uL (ref 0.0–0.5)
Eosinophils Relative: 2 %
HCT: 40.6 % (ref 39.0–52.0)
Hemoglobin: 13.9 g/dL (ref 13.0–17.0)
Immature Granulocytes: 0 %
Lymphocytes Relative: 29 %
Lymphs Abs: 2.9 10*3/uL (ref 0.7–4.0)
MCH: 31.4 pg (ref 26.0–34.0)
MCHC: 34.2 g/dL (ref 30.0–36.0)
MCV: 91.6 fL (ref 80.0–100.0)
Monocytes Absolute: 0.7 10*3/uL (ref 0.1–1.0)
Monocytes Relative: 7 %
Neutro Abs: 6.1 10*3/uL (ref 1.7–7.7)
Neutrophils Relative %: 61 %
Platelets: 274 10*3/uL (ref 150–400)
RBC: 4.43 MIL/uL (ref 4.22–5.81)
RDW: 13 % (ref 11.5–15.5)
WBC: 10.1 10*3/uL (ref 4.0–10.5)
nRBC: 0 % (ref 0.0–0.2)

## 2022-05-31 NOTE — ED Triage Notes (Signed)
Patient reports that he has hypertension. Patient states his BP was 208/115 at home.  BP in triage-212/132.  Patient c/o intermittent blurred vision and headaches x a few months.
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
## 2022-06-01 ENCOUNTER — Encounter (HOSPITAL_COMMUNITY): Payer: Self-pay

## 2022-06-01 ENCOUNTER — Ambulatory Visit (HOSPITAL_COMMUNITY)
Admission: EM | Admit: 2022-06-01 | Discharge: 2022-06-01 | Disposition: A | Discharge status: Home / Self Care | Payer: Self-pay | Attending: Physician Assistant | Admitting: Physician Assistant

## 2022-06-01 DIAGNOSIS — N289 Disorder of kidney and ureter, unspecified: Secondary | ICD-10-CM

## 2022-06-01 DIAGNOSIS — I10 Essential (primary) hypertension: Secondary | ICD-10-CM

## 2022-06-01 LAB — POCT URINALYSIS DIPSTICK, ED / UC
Bilirubin Urine: NEGATIVE
Glucose, UA: NEGATIVE mg/dL
Ketones, ur: 15 mg/dL — AB
Leukocytes,Ua: NEGATIVE
Nitrite: NEGATIVE
Protein, ur: 30 mg/dL — AB
Specific Gravity, Urine: >=1.03 (ref 1.005–1.030)
Urobilinogen, UA: 0.2 mg/dL (ref 0.0–1.0)
pH: 5.5 (ref 5.0–8.0)

## 2022-06-01 MED ORDER — AMLODIPINE BESYLATE 10 MG PO TABS: 10.0000 mg | ORAL_TABLET | Freq: Every day | ORAL | 0 refills | Status: DC

## 2022-06-01 MED ORDER — LOSARTAN POTASSIUM 50 MG PO TABS: 50.0000 mg | ORAL_TABLET | Freq: Every day | ORAL | 2 refills | Status: DC

## 2022-06-01 NOTE — ED Triage Notes (Signed)
Pt is here for elevated b/p for a long time as per pt

## 2022-06-01 NOTE — ED Provider Notes (Signed)
[facility]    CSN: [id number] Arrival date & time: 06/01/22  1154      History   Chief Complaint Chief Complaint  Patient presents with   Hypertension    HPI Matthew Warren is a 46 y.o. male.   Patient presents today for evaluation of elevated blood pressure reading.  Reports that several weeks ago his twin brother was diagnosed with chronic kidney disease related to hypertension.  Patient took his blood pressure when his brother received a home blood pressure monitoring device was noted to be 208/???.  He was given a medication to help with his blood pressure by a nurse that lived in the same building but does not member the name of this; believes that it is hydrochlorothiazide.  He has not taken this in approximately 1 week.  Denies formal diagnosis of hypertension and has not taken antihypertensive medications in the past.  He has been taking NyQuil intermittently related to recent URI.  He has not been taking any other decongestants, regular NSAIDs, denies any increase in caffeine/sodium consumption.  He denies any chest pain, shortness of breath, headache, vision change, dizziness.  He initially presented to the emergency room but they had a 15+ hour wait.  Basic labs were obtained including CMP that showed slightly elevated creatinine at 1.33; previous was 0.97 12 years ago.  Patient left without being seen.    History reviewed. No pertinent past medical history.  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 Authorizing Provider  amLODipine (NORVASC) 10 MG tablet Take 1 tablet (10 mg total) by mouth daily. 06/01/22  Yes Nyjah Denio, Noberto Retort, PA-C     Family History Family History  Problem Relation Age of Onset   Bipolar disorder Mother    Heart attack Father     Social History Social History   Tobacco Use   Smoking status: Former    Passive exposure: Never  Building services engineer Use: Never used  Substance Use Topics   Alcohol use: Never   Drug use: Never     Allergies   Patient has no known allergies.   Review of Systems Review of Systems  Constitutional:  Negative for activity change, appetite change, fatigue and fever.  Eyes:  Negative for photophobia and visual disturbance.  Respiratory:  Negative for cough and shortness of breath.   Cardiovascular:  Negative for chest pain.  Gastrointestinal:  Negative for abdominal pain, diarrhea, nausea and vomiting.  Neurological:  Negative for dizziness, light-headedness and headaches.     Physical Exam Triage Vital Signs ED Triage Vitals [06/01/22 1328]  Enc Vitals Group     BP (!) 204/121     Pulse Rate 88     Resp 16     Temp 98.6 F (37 C)     Temp Source Oral     SpO2 98 %     Weight      Height      Head Circumference      Peak Flow      Pain Score      Pain Loc      Pain Edu?  Excl. in GC?    No data found.  Updated Vital Signs BP (!) 204/121 (BP Location: Left Arm)   Pulse 88   Temp 98.6 F (37 C) (Oral)   Resp 16   SpO2 98%   Visual Acuity Right Eye Distance:   Left Eye Distance:   Bilateral Distance:    Right Eye Near:   Left Eye Near:    Bilateral Near:     Physical Exam Vitals reviewed.  Constitutional:      General: He is awake.     Appearance: Normal appearance. He is well-developed. He is not ill-appearing.     Comments: Very pleasant male appears stated age in no acute distress sitting comfortably in exam room  HENT:     Head: Normocephalic and atraumatic.  Cardiovascular:     Rate and Rhythm: Normal rate and regular rhythm.     Heart sounds: Normal heart sounds, S1 normal and S2 normal. No murmur  heard. Pulmonary:     Effort: Pulmonary effort is normal.     Breath sounds: Normal breath sounds. No stridor. No wheezing, rhonchi or rales.     Comments: Clear to auscultation bilaterally Abdominal:     General: Bowel sounds are normal.     Palpations: Abdomen is soft.     Tenderness: There is no abdominal tenderness.  Musculoskeletal:     Right lower leg: No edema.     Left lower leg: No edema.  Neurological:     Mental Status: He is alert.  Psychiatric:        Behavior: Behavior is cooperative.      UC Treatments / Results  Labs (all labs ordered are listed, but only abnormal results are displayed) Labs Reviewed  POCT URINALYSIS DIPSTICK, ED / UC - Abnormal; Notable for the following components:      Result Value   Ketones, ur 15 (*)    Hgb urine dipstick TRACE (*)    Protein, ur 30 (*)    All other components within normal limits    EKG   Radiology No results found.  Procedures Procedures (including critical care time)  Medications Ordered in UC Medications - No data to display  Initial Impression / Assessment and Plan / UC Course  I have reviewed the triage vital signs and the nursing notes.  Pertinent labs & imaging results that were available during my care of the patient were reviewed by me and considered in my medical decision making (see chart for details).     Patient's blood pressure is significantly elevated in clinic.  He denies any signs/symptoms of endorgan damage.  He did have slightly elevated creatinine compared to 12 years ago but we do not have any additional recent testing but does not meet threshold for AKI.  There was trace protein in urine.  We discussed potential utility of going back to the emergency room, however, given patient had already waited for a prolonged period of time he declined this.  We will start amlodipine 10 mg daily.  Discussed that ultimately he will need to also start other medication such as ARB, however, would prefer  to do this when we can monitor labs more closely such as when he is established with a primary care.  He was instructed to avoid NSAIDs, caffeine, sodium, decongestants.  Given slightly elevated creatinine, significantly elevated blood pressure, protein in urine he was encouraged to follow-up with nephrology and was given contact information for local provider.  He is to call  to schedule an appointment.  He does not currently have a primary care so we will try to establish him with 1 via PCP assistance.  Discussed that someone should recheck his blood pressure within 1 to 2 weeks and if this cannot be done with his primary care he can return here for reevaluation.  Discussed that if he develops any headache, chest pain, shortness of breath, vision change in the setting of high blood pressure he needs to go to the emergency room.  Final Clinical Impressions(s) / UC Diagnoses   Final diagnoses:  Elevated blood pressure reading with diagnosis of hypertension  Abnormal kidney function     Discharge Instructions      Start amlodipine 10 mg daily.  It is very important that you follow-up with a kidney specialist for additional testing.  Please call them to schedule an appointment.  Show months to reach out to you to schedule a primary care appointment.  If you are unable to see them within 1 to 2 weeks please return here so we can recheck your blood pressure as well as repeat blood work and adjust your medication.  Avoid decongestants, caffeine, sodium, NSAIDs (aspirin, ibuprofen/Advil, naproxen/Aleve).  Monitor your blood pressure closely at home.  If you develop any chest pain, shortness of breath, headache, vision change, dizziness, swelling in your legs you need to go to the emergency room immediately.     ED Prescriptions     Medication Sig Dispense Auth. Provider   losartan (COZAAR) 50 MG tablet  (Status: Discontinued) Take 1 tablet (50 mg total) by mouth daily. 30 tablet Ikeisha Blumberg K, PA-C    amLODipine (NORVASC) 10 MG tablet Take 1 tablet (10 mg total) by mouth daily. 90 tablet Jalyne Brodzinski, Derry Skill, PA-C      PDMP not reviewed this encounter.   Terrilee Croak, PA-C 06/01/22 1408

## 2022-06-01 NOTE — Discharge Instructions (Addendum)
Start amlodipine 10 mg daily.  It is very important that you follow-up with a kidney specialist for additional testing.  Please call them to schedule an appointment.  Show months to reach out to you to schedule a primary care appointment.  If you are unable to see them within 1 to 2 weeks please return here so we can recheck your blood pressure as well as repeat blood work and adjust your medication.  Avoid decongestants, caffeine, sodium, NSAIDs (aspirin, ibuprofen/Advil, naproxen/Aleve).  Monitor your blood pressure closely at home.  If you develop any chest pain, shortness of breath, headache, vision change, dizziness, swelling in your legs you need to go to the emergency room immediately.

## 2022-07-19 ENCOUNTER — Ambulatory Visit: Payer: Self-pay | Admitting: Family Medicine

## 2023-04-28 ENCOUNTER — Emergency Department (HOSPITAL_COMMUNITY): Payer: Medicaid Other

## 2023-04-28 ENCOUNTER — Other Ambulatory Visit: Payer: Self-pay

## 2023-04-28 ENCOUNTER — Encounter (HOSPITAL_COMMUNITY): Payer: Self-pay

## 2023-04-28 ENCOUNTER — Inpatient Hospital Stay (HOSPITAL_COMMUNITY)
Admission: EM | Admit: 2023-04-28 | Discharge: 2023-05-01 | DRG: 291 | Disposition: A | Discharge status: Home / Self Care | Payer: Medicaid Other | Attending: Internal Medicine | Admitting: Internal Medicine

## 2023-04-28 DIAGNOSIS — Z87891 Personal history of nicotine dependence: Secondary | ICD-10-CM | POA: Diagnosis not present

## 2023-04-28 DIAGNOSIS — E876 Hypokalemia: Secondary | ICD-10-CM | POA: Diagnosis present

## 2023-04-28 DIAGNOSIS — I5021 Acute systolic (congestive) heart failure: Secondary | ICD-10-CM

## 2023-04-28 DIAGNOSIS — J40 Bronchitis, not specified as acute or chronic: Secondary | ICD-10-CM | POA: Diagnosis present

## 2023-04-28 DIAGNOSIS — Z5941 Food insecurity: Secondary | ICD-10-CM | POA: Diagnosis not present

## 2023-04-28 DIAGNOSIS — Z8249 Family history of ischemic heart disease and other diseases of the circulatory system: Secondary | ICD-10-CM | POA: Diagnosis not present

## 2023-04-28 DIAGNOSIS — F1410 Cocaine abuse, uncomplicated: Secondary | ICD-10-CM | POA: Diagnosis present

## 2023-04-28 DIAGNOSIS — R000 Tachycardia, unspecified: Secondary | ICD-10-CM | POA: Diagnosis present

## 2023-04-28 DIAGNOSIS — N179 Acute kidney failure, unspecified: Secondary | ICD-10-CM | POA: Diagnosis present

## 2023-04-28 DIAGNOSIS — I160 Hypertensive urgency: Secondary | ICD-10-CM | POA: Diagnosis present

## 2023-04-28 DIAGNOSIS — Z79899 Other long term (current) drug therapy: Secondary | ICD-10-CM | POA: Diagnosis not present

## 2023-04-28 DIAGNOSIS — E785 Hyperlipidemia, unspecified: Secondary | ICD-10-CM | POA: Diagnosis present

## 2023-04-28 DIAGNOSIS — I110 Hypertensive heart disease with heart failure: Principal | ICD-10-CM | POA: Diagnosis present

## 2023-04-28 DIAGNOSIS — Z634 Disappearance and death of family member: Secondary | ICD-10-CM | POA: Diagnosis not present

## 2023-04-28 DIAGNOSIS — I5041 Acute combined systolic (congestive) and diastolic (congestive) heart failure: Secondary | ICD-10-CM

## 2023-04-28 DIAGNOSIS — Z885 Allergy status to narcotic agent status: Secondary | ICD-10-CM

## 2023-04-28 DIAGNOSIS — D751 Secondary polycythemia: Secondary | ICD-10-CM | POA: Diagnosis present

## 2023-04-28 DIAGNOSIS — Z597 Insufficient social insurance and welfare support: Secondary | ICD-10-CM

## 2023-04-28 DIAGNOSIS — I10 Essential (primary) hypertension: Secondary | ICD-10-CM | POA: Insufficient documentation

## 2023-04-28 DIAGNOSIS — Z91141 Patient's other noncompliance with medication regimen due to financial hardship: Secondary | ICD-10-CM

## 2023-04-28 DIAGNOSIS — I509 Heart failure, unspecified: Principal | ICD-10-CM

## 2023-04-28 DIAGNOSIS — Z7984 Long term (current) use of oral hypoglycemic drugs: Secondary | ICD-10-CM

## 2023-04-28 DIAGNOSIS — R0602 Shortness of breath: Secondary | ICD-10-CM | POA: Diagnosis present

## 2023-04-28 DIAGNOSIS — Z1152 Encounter for screening for COVID-19: Secondary | ICD-10-CM

## 2023-04-28 LAB — COMPREHENSIVE METABOLIC PANEL
ALT: 31 U/L (ref 0–44)
AST: 22 U/L (ref 15–41)
Albumin: 3.6 g/dL (ref 3.5–5.0)
Alkaline Phosphatase: 113 U/L (ref 38–126)
Anion gap: 8 (ref 5–15)
BUN: 19 mg/dL (ref 6–20)
CO2: 24 mmol/L (ref 22–32)
Calcium: 8.9 mg/dL (ref 8.9–10.3)
Chloride: 104 mmol/L (ref 98–111)
Creatinine, Ser: 1.35 mg/dL — ABNORMAL HIGH (ref 0.61–1.24)
GFR, Estimated: >60 mL/min (ref >60)
Glucose, Bld: 116 mg/dL — ABNORMAL HIGH (ref 70–99)
Potassium: 3.5 mmol/L (ref 3.5–5.1)
Sodium: 136 mmol/L (ref 135–145)
Total Bilirubin: 0.9 mg/dL (ref 0.3–1.2)
Total Protein: 7.2 g/dL (ref 6.5–8.1)

## 2023-04-28 LAB — D-DIMER, QUANTITATIVE: D-Dimer, Quant: 2.25 ug{FEU}/mL — ABNORMAL HIGH (ref 0.00–0.50)

## 2023-04-28 LAB — CBC WITH DIFFERENTIAL/PLATELET
Abs Immature Granulocytes: 0.03 10*3/uL (ref 0.00–0.07)
Basophils Absolute: 0.1 10*3/uL (ref 0.0–0.1)
Basophils Relative: 1 %
Eosinophils Absolute: 0.2 10*3/uL (ref 0.0–0.5)
Eosinophils Relative: 2 %
HCT: 40 % (ref 39.0–52.0)
Hemoglobin: 13.3 g/dL (ref 13.0–17.0)
Immature Granulocytes: 0 %
Lymphocytes Relative: 22 %
Lymphs Abs: 2.2 10*3/uL (ref 0.7–4.0)
MCH: 29.9 pg (ref 26.0–34.0)
MCHC: 33.3 g/dL (ref 30.0–36.0)
MCV: 89.9 fL (ref 80.0–100.0)
Monocytes Absolute: 0.5 10*3/uL (ref 0.1–1.0)
Monocytes Relative: 5 %
Neutro Abs: 7.1 10*3/uL (ref 1.7–7.7)
Neutrophils Relative %: 70 %
Platelets: 333 10*3/uL (ref 150–400)
RBC: 4.45 MIL/uL (ref 4.22–5.81)
RDW: 13.9 % (ref 11.5–15.5)
WBC: 10.1 10*3/uL (ref 4.0–10.5)
nRBC: 0 % (ref 0.0–0.2)

## 2023-04-28 LAB — HIV ANTIBODY (ROUTINE TESTING W REFLEX): HIV Screen 4th Generation wRfx: NONREACTIVE

## 2023-04-28 LAB — BRAIN NATRIURETIC PEPTIDE: B Natriuretic Peptide: 1261.5 pg/mL — ABNORMAL HIGH (ref 0.0–100.0)

## 2023-04-28 MED ORDER — HYDRALAZINE HCL 20 MG/ML IJ SOLN
5.0000 mg | Freq: Four times a day (QID) | INTRAMUSCULAR | Status: DC | PRN
  Administered 2023-04-28 – 2023-04-29 (×3): 5 mg via INTRAVENOUS
  Filled 2023-04-28 (×3): qty 1

## 2023-04-28 MED ORDER — ONDANSETRON HCL 4 MG/2ML IJ SOLN: 4.0000 mg | Freq: Four times a day (QID) | INTRAMUSCULAR | Status: DC | PRN

## 2023-04-28 MED ORDER — ACETAMINOPHEN 650 MG RE SUPP: 650.0000 mg | Freq: Four times a day (QID) | RECTAL | Status: DC | PRN

## 2023-04-28 MED ORDER — MELATONIN 5 MG PO TABS
2.5000 mg | ORAL_TABLET | Freq: Every evening | ORAL | Status: DC | PRN
  Administered 2023-04-28: 2.5 mg via ORAL
  Filled 2023-04-28: qty 1

## 2023-04-28 MED ORDER — ONDANSETRON HCL 4 MG PO TABS: 4.0000 mg | ORAL_TABLET | Freq: Four times a day (QID) | ORAL | Status: DC | PRN

## 2023-04-28 MED ORDER — FUROSEMIDE 10 MG/ML IJ SOLN
40.0000 mg | Freq: Every day | INTRAMUSCULAR | Status: DC
  Administered 2023-04-29 – 2023-04-30 (×2): 40 mg via INTRAVENOUS
  Filled 2023-04-28 (×2): qty 4

## 2023-04-28 MED ORDER — IOHEXOL 350 MG/ML SOLN
75.0000 mL | Freq: Once | INTRAVENOUS | Status: AC | PRN
  Administered 2023-04-28: 75 mL via INTRAVENOUS

## 2023-04-28 MED ORDER — AMLODIPINE BESYLATE 10 MG PO TABS
10.0000 mg | ORAL_TABLET | Freq: Every day | ORAL | Status: DC
  Administered 2023-04-28 – 2023-05-01 (×4): 10 mg via ORAL
  Filled 2023-04-28 (×3): qty 1
  Filled 2023-04-28: qty 2

## 2023-04-28 MED ORDER — ENOXAPARIN SODIUM 40 MG/0.4ML IJ SOSY
40.0000 mg | PREFILLED_SYRINGE | INTRAMUSCULAR | Status: DC
  Administered 2023-04-28 – 2023-04-30 (×3): 40 mg via SUBCUTANEOUS
  Filled 2023-04-28 (×3): qty 0.4

## 2023-04-28 MED ORDER — FUROSEMIDE 10 MG/ML IJ SOLN
40.0000 mg | Freq: Once | INTRAMUSCULAR | Status: AC
  Administered 2023-04-28: 40 mg via INTRAVENOUS
  Filled 2023-04-28: qty 4

## 2023-04-28 MED ORDER — ALBUTEROL SULFATE (2.5 MG/3ML) 0.083% IN NEBU: 2.5000 mg | INHALATION_SOLUTION | RESPIRATORY_TRACT | Status: DC | PRN

## 2023-04-28 MED ORDER — POTASSIUM CHLORIDE 20 MEQ PO PACK
20.0000 meq | PACK | Freq: Once | ORAL | Status: AC
  Administered 2023-04-28: 20 meq via ORAL
  Filled 2023-04-28: qty 1

## 2023-04-28 MED ORDER — ACETAMINOPHEN 325 MG PO TABS
650.0000 mg | ORAL_TABLET | Freq: Four times a day (QID) | ORAL | Status: DC | PRN
  Administered 2023-04-29 (×2): 650 mg via ORAL
  Filled 2023-04-28 (×2): qty 2

## 2023-04-28 NOTE — H&P (Signed)
History and Physical  Matthew Warren KGM:[id number] DOB: [birth year] DOA: 04/28/2023  PCP: Patient, No Pcp Per   Chief Complaint: Shortness of breath  HPI: Matthew Warren is a 47 y.o. male with medical history significant for hypertension on amlodipine who has been noncompliant with his medication being admitted to the hospital with several days of shortness of breath with exertion and evidence of acute heart failure.  States that he has had a little bit of nagging chest burning when he gets short of breath with exertion, this has been progressive for the last 2 to 3 days, when it first started he had a little bit of yellowish sputum, but now he is coughing up some white sputum.  Denies orthopnea, denies lower extremity swelling.  He has no personal prior history of cardiac disease.  ED Course: In the emergency department, he is tachycardic as high as 188/142, saturating well on room air.  Lab work was done including CBC and CMP, significant for creatinine 1.35 which appears to be his baseline.  BNP elevated 1261.  He was given a dose of IV Lasix, and hospitalist was contacted for admission.  Review of Systems: Please see HPI for pertinent positives and negatives. A complete 10 system review of systems are otherwise negative.  History reviewed. No pertinent past medical history. Past Surgical History:  Procedure Laterality Date   APPENDECTOMY      Social History:  reports that he has quit smoking. He has never been exposed to tobacco smoke. He has never used smokeless tobacco. He reports that he does not drink alcohol and does not use drugs.   Allergies  Allergen Reactions   Codeine Other (See Comments)    Unknown.    Family History  Problem Relation Age of Onset   Bipolar disorder Mother    Heart attack Father      Prior to Admission medications   Medication Sig Start Date End Date Taking? Authorizing Provider  Melatonin 1 MG CAPS Take 2 mg by mouth at bedtime as needed  (sleep).   Yes [provider]  Phenylephrine-Pheniramine-DM [facility] COLD & COUGH PO) Take 1 Dose by mouth as needed (cough).   Yes [provider]  Pseudoeph-Doxylamine-DM-APAP (NYQUIL PO) Take 2 Doses by mouth at bedtime as needed (sleep/cough).   Yes [provider]  amLODipine (NORVASC) 10 MG tablet Take 1 tablet (10 mg total) by mouth daily. Patient not taking: Reported on 04/28/2023 06/01/22   Raspet, Noberto Retort, PA-C    Physical Exam: BP (!) 177/122   Pulse 95   Temp 97.8 F (36.6 C) (Oral)   Resp (!) 25   Ht 5\' 10"  (1.778 m)   Wt 79.4 kg   SpO2 92%   BMI 25.11 kg/m   General:  Alert, oriented, calm, in no acute distress  Eyes: EOMI, clear conjuctivae, white sclerea Neck: supple, no masses, trachea mildline  Cardiovascular: RRR, no murmurs or rubs, no peripheral edema  Respiratory: clear to auscultation bilaterally, no wheezes, no crackles  Abdomen: soft, nontender, nondistended, normal bowel tones heard  Skin: dry, no rashes  Musculoskeletal: no joint effusions, normal range of motion  Psychiatric: appropriate affect, normal speech  Neurologic: extraocular muscles intact, clear speech, moving all extremities with intact sensorium         Labs on Admission:  Basic Metabolic Panel: Recent Labs  Lab 04/28/23 0940  NA 136  K 3.5  CL 104  CO2 24  GLUCOSE 116*  BUN 19  CREATININE 1.35*  CALCIUM 8.9   Liver Function Tests: Recent Labs  Lab 04/28/23 0940  AST 22  ALT 31  ALKPHOS 113  BILITOT 0.9  PROT 7.2  ALBUMIN 3.6   No results for input(s): "LIPASE", "AMYLASE" in the last 168 hours. No results for input(s): "AMMONIA" in the last 168 hours. CBC: Recent Labs  Lab 04/28/23 0940  WBC 10.1  NEUTROABS 7.1  HGB 13.3  HCT 40.0  MCV 89.9  PLT 333   Cardiac Enzymes: No results for input(s): "CKTOTAL", "CKMB", "CKMBINDEX", "TROPONINI" in the last 168 hours.  BNP (last 3 results) Recent Labs    04/28/23 0940  BNP 1,261.5*     ProBNP (last 3 results) No results for input(s): "PROBNP" in the last 8760 hours.  CBG: No results for input(s): "GLUCAP" in the last 168 hours.  Radiological Exams on Admission: CT Angio Chest PE W/Cm &/Or Wo Cm  Result Date: 04/28/2023 CLINICAL DATA:  Positive D-dimer. Shortness of breath. Evaluate for pulmonary embolism. EXAM: CT ANGIOGRAPHY CHEST WITH CONTRAST TECHNIQUE: Multidetector CT imaging of the chest was performed using the standard protocol during bolus administration of intravenous contrast. Multiplanar CT image reconstructions and MIPs were obtained to evaluate the vascular anatomy. RADIATION DOSE REDUCTION: This exam was performed according to the departmental dose-optimization program which includes automated exposure control, adjustment of the mA and/or kV according to patient size and/or use of iterative reconstruction technique. CONTRAST:  75mL OMNIPAQUE IOHEXOL 350 MG/ML SOLN COMPARISON:  Chest radiograph earlier same day FINDINGS: Vascular Findings: There is adequate opacification of the pulmonary arterial system with the main pulmonary artery measuring 517 Hounsfield units. There are no discrete filling defects within the pulmonary arterial tree to suggest pulmonary embolism. Normal caliber of the main pulmonary artery. Cardiomegaly.  No pericardial effusion. No evidence of thoracic aortic aneurysm or dissection on this nongated examination. Conventional configuration of the aortic arch. The branch vessels of the aortic arch appear patent throughout their imaged courses. Review of the MIP images confirms the above findings. ---------------------------------------------------------------------------------- Nonvascular Findings: Mediastinum/Lymph Nodes: Mediastinal lymphadenopathy with index right-sided paratracheal lymph node measuring 1.3 cm in diameter (53, series 4). Additional scattered prevascular, hilar and thoracic inlet lymph nodes are numerous and prominent though  individually nonenlarged by size criteria. No axillary lymphadenopathy. Lungs/Pleura: Scattered ill-defined areas ground-glass within the right upper lobe (representative images 54 and 60, series 12), without associated air bronchograms. Trace/small bilateral effusions with associated bibasilar ground-glass atelectasis. There is mild thickening of the pulmonary interstitium. No air bronchograms. No pneumothorax. The central pulmonary airways appear patent. Upper abdomen: Limited early arterial phase evaluation of the upper abdomen demonstrates mild nodularity of the hepatic contour. Musculoskeletal: Regional soft tissues appear normal. Normal appearance of the thyroid gland. No acute or aggressive osseous abnormalities. DDD at C6-C7 with disc space height loss endplate irregularity and sclerosis. 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 IMPRESSION: 1. No evidence of pulmonary embolism. 2. Findings suggestive of pulmonary edema including cardiomegaly, mild thickening of the pulmonary interstitium and trace/small bilateral effusions. 3. Scattered ill-defined areas of ground-glass within the right upper lobe, potentially representative of alveolar pulmonary edema though conceivably early infection, including atypical etiologies, could have a similar appearance. 4. Mildly enlarged mediastinal and prominent bilateral hilar and thoracic inlet lymph nodes are nonspecific though favored to be reactive in etiology. 5. Mild nodularity of the hepatic contour, nonspecific though could be seen in the setting of cirrhosis. Correlation with LFTs is advised. Electronically Signed   By: Jonny Ruiz  Watts M.D.   On: 04/28/2023 12:29   DG Chest 2 View  Result Date: 04/28/2023 CLINICAL DATA:  Two-week history of nonproductive cough associated with several days of shortness of breath EXAM: CHEST - 2 VIEW COMPARISON:  Chest radiograph dated 09/29/2009 FINDINGS: Normal lung volumes. Bilateral peribronchial wall  thickening and interstitial opacities. No pleural effusion or pneumothorax. Enlarged cardiomediastinal silhouette. No acute osseous abnormality. IMPRESSION: 1. Bilateral peribronchial wall thickening and interstitial opacities, which can be seen in the setting of bronchitis or pulmonary edema. 2. Mild cardiomegaly, new compared to 09/29/2009. Recommend correlation with EKG and echocardiography as clinically indicated. Electronically Signed   By: Agustin Cree M.D.   On: 04/28/2023 09:30    Assessment/Plan This is a pleasant 47 year old gentleman with a history of poorly controlled hypertension and CKD being admitted to the hospital with shortness of breath with exertion and evidence of acute heart failure including elevated BNP, and pulmonary edema.  Acute CHF-suspected due to elevated BNP, pulmonary edema, dyspnea with exertion, and history of poorly controlled hypertension.  I suspect hypertensive heart disease. -Inpatient admission -Heart healthy diet with fluid restriction -Check 2D echo -Continue Lasix 40 mg daily -Monitor renal function and electrolytes daily -Discussed importance of blood pressure control  Hypertension-poorly controlled, patient is not compliant with his amlodipine -Resume home amlodipine -Anticipate improvement with diuresis -IV hydralazine as needed for elevated blood pressure  CKD-with elevated creatinine, likely due to hypertensive nephropathy, currently at baseline  DVT prophylaxis: Lovenox     Code Status: Full Code  Consults called: None  Admission status: The appropriate patient status for this patient is INPATIENT. Inpatient status is judged to be reasonable and necessary in order to provide the required intensity of service to ensure the patient's safety. The patient's presenting symptoms, physical exam findings, and initial radiographic and laboratory data in the context of their chronic comorbidities is felt to place them at high risk for further clinical  deterioration. Furthermore, it is not anticipated that the patient will be medically stable for discharge from the hospital within 2 midnights of admission.    I certify that at the point of admission it is my clinical judgment that the patient will require inpatient hospital care spanning beyond 2 midnights from the point of admission due to high intensity of service, high risk for further deterioration and high frequency of surveillance required   Time spent: 52 minutes  Jorell Agne Sharlette Dense MD [facility] Pager [phone]  If 7PM-7AM, please contact night-coverage [email or web address] Password [email or web address]  04/28/2023, 1:31 PM

## 2023-04-28 NOTE — ED Triage Notes (Signed)
Pt complaining of SOB that has been going on for the last couple of days that increases with excretion. Pt also endorses a non-productive cough for apprx 2x wks. States that this feels similar that the last time he acquired pneumonia.

## 2023-04-28 NOTE — ED Notes (Signed)
ED TO INPATIENT HANDOFF REPORT  Name/Age/Gender Matthew Warren 47 y.o. male  Code Status    Code Status Orders  (From admission, onward)           Start     Ordered   04/28/23 1330  Full code  Continuous       Question:  By:  Answer:  Consent: discussion documented in EHR   04/28/23 1330           Code Status History     This patient has a current code status but no historical code status.       Home/SNF/Other Home  Chief Complaint Acute CHF (congestive heart failure) (HCC) [I50.9]  Level of Care/Admitting Diagnosis ED Disposition     ED Disposition  Admit   Condition  --   Comment  Hospital Area: [facility] [[id number]]  Level of Care: Telemetry [5]  Admit to tele based on following criteria: Acute CHF  May admit patient to [facility] or [facility] if equivalent level of care is available:: Yes  Covid Evaluation: Asymptomatic - no recent exposure (last 10 days) testing not required  Diagnosis: Acute CHF (congestive heart failure) [facility]) [[id number]]  Admitting Physician: Maryln Gottron [[id number]]  Attending Physician: Olexa.Dam, MIR Jaxson.Roy [[id number]]  Certification:: I certify this patient will need inpatient services for at least 2 midnights  Expected Medical Readiness: 04/30/2023          Medical History History reviewed. No pertinent past medical history.  Allergies Allergies  Allergen Reactions   Codeine Other (See Comments)    Unknown.    IV Location/Drains/Wounds Patient Lines/Drains/Airways Status     Active Line/Drains/Airways     Name Placement date Placement time Site Days   Peripheral IV 04/28/23 20 G 1" Anterior;Left Forearm 04/28/23  0942  Forearm  less than 1            Labs/Imaging Results for orders placed or performed during the hospital encounter of 04/28/23 (from the past 48 hour(s))  Comprehensive metabolic panel     Status: Abnormal   Collection Time: 04/28/23  9:40 AM  Result Value Ref Range    Sodium 136 135 - 145 mmol/L   Potassium 3.5 3.5 - 5.1 mmol/L   Chloride 104 98 - 111 mmol/L   CO2 24 22 - 32 mmol/L   Glucose, Bld 116 (H) 70 - 99 mg/dL    Comment: Glucose reference range applies only to samples taken after fasting for at least 8 hours.   BUN 19 6 - 20 mg/dL   Creatinine, Ser 2.44 (H) 0.61 - 1.24 mg/dL   Calcium 8.9 8.9 - 01.0 mg/dL   Total Protein 7.2 6.5 - 8.1 g/dL   Albumin 3.6 3.5 - 5.0 g/dL   AST 22 15 - 41 U/L   ALT 31 0 - 44 U/L   Alkaline Phosphatase 113 38 - 126 U/L   Total Bilirubin 0.9 0.3 - 1.2 mg/dL   GFR, Estimated >27 >25 mL/min    Comment: (NOTE) Calculated using the CKD-EPI Creatinine Equation (2021)    Anion gap 8 5 - 15    Comment: Performed at [facility], [address] [place], [place] [address]  CBC with Differential     Status: None   Collection Time: 04/28/23  9:40 AM  Result Value Ref Range   WBC 10.1 4.0 - 10.5 K/uL   RBC 4.45 4.22 - 5.81 MIL/uL   Hemoglobin 13.3 13.0 - 17.0  g/dL   HCT 13.0 86.5 - 78.4 %   MCV 89.9 80.0 - 100.0 fL   MCH 29.9 26.0 - 34.0 pg   MCHC 33.3 30.0 - 36.0 g/dL   RDW 69.6 29.5 - 28.4 %   Platelets 333 150 - 400 K/uL   nRBC 0.0 0.0 - 0.2 %   Neutrophils Relative % 70 %   Neutro Abs 7.1 1.7 - 7.7 K/uL   Lymphocytes Relative 22 %   Lymphs Abs 2.2 0.7 - 4.0 K/uL   Monocytes Relative 5 %   Monocytes Absolute 0.5 0.1 - 1.0 K/uL   Eosinophils Relative 2 %   Eosinophils Absolute 0.2 0.0 - 0.5 K/uL   Basophils Relative 1 %   Basophils Absolute 0.1 0.0 - 0.1 K/uL   Immature Granulocytes 0 %   Abs Immature Granulocytes 0.03 0.00 - 0.07 K/uL    Comment: Performed at [facility], [address] [place], [place] [address]  Brain natriuretic peptide     Status: Abnormal   Collection Time: 04/28/23  9:40 AM  Result Value Ref Range   B Natriuretic Peptide 1,261.5 (H) 0.0 - 100.0 pg/mL    Comment: Performed at [facility], [address],  [place], [place] [address]  D-dimer, quantitative     Status: Abnormal   Collection Time: 04/28/23  9:40 AM  Result Value Ref Range   D-Dimer, Quant 2.25 (H) 0.00 - 0.50 ug/mL-FEU    Comment: (NOTE) At the manufacturer cut-off value of 0.5 g/mL FEU, this assay has a negative predictive value of 95-100%.This assay is intended for use in conjunction with a clinical pretest probability (PTP) assessment model to exclude pulmonary embolism (PE) and deep venous thrombosis (DVT) in outpatients suspected of PE or DVT. Results should be correlated with clinical presentation. Performed at [facility], [address], [place], [place] [address]    CT Angio Chest PE W/Cm &/Or Wo Cm  Result Date: 04/28/2023 CLINICAL DATA:  Positive D-dimer. Shortness of breath. Evaluate for pulmonary embolism. EXAM: CT ANGIOGRAPHY CHEST WITH CONTRAST TECHNIQUE: Multidetector CT imaging of the chest was performed using the standard protocol during bolus administration of intravenous contrast. Multiplanar CT image reconstructions and MIPs were obtained to evaluate the vascular anatomy. RADIATION DOSE REDUCTION: This exam was performed according to the departmental dose-optimization program which includes automated exposure control, adjustment of the mA and/or kV according to patient size and/or use of iterative reconstruction technique. CONTRAST:  75mL OMNIPAQUE IOHEXOL 350 MG/ML SOLN COMPARISON:  Chest radiograph earlier same day FINDINGS: Vascular Findings: There is adequate opacification of the pulmonary arterial system with the main pulmonary artery measuring 517 Hounsfield units. There are no discrete filling defects within the pulmonary arterial tree to suggest pulmonary embolism. Normal caliber of the main pulmonary artery. Cardiomegaly.  No pericardial effusion. No evidence of thoracic aortic aneurysm or dissection on this nongated examination. Conventional configuration of the aortic arch. The branch  vessels of the aortic arch appear patent throughout their imaged courses. Review of the MIP images confirms the above findings. ---------------------------------------------------------------------------------- Nonvascular Findings: Mediastinum/Lymph Nodes: Mediastinal lymphadenopathy with index right-sided paratracheal lymph node measuring 1.3 cm in diameter (53, series 4). Additional scattered prevascular, hilar and thoracic inlet lymph nodes are numerous and prominent though individually nonenlarged by size criteria. No axillary lymphadenopathy. Lungs/Pleura: Scattered ill-defined areas ground-glass within the right upper lobe (representative images 54 and 60, series 12), without associated air bronchograms. Trace/small bilateral effusions with associated bibasilar ground-glass atelectasis.  There is mild thickening of the pulmonary interstitium. No air bronchograms. No pneumothorax. The central pulmonary airways appear patent. Upper abdomen: Limited early arterial phase evaluation of the upper abdomen demonstrates mild nodularity of the hepatic contour. Musculoskeletal: Regional soft tissues appear normal. Normal appearance of the thyroid gland. No acute or aggressive osseous abnormalities. DDD at C6-C7 with disc space height loss endplate irregularity and sclerosis. 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 IMPRESSION: 1. No evidence of pulmonary embolism. 2. Findings suggestive of pulmonary edema including cardiomegaly, mild thickening of the pulmonary interstitium and trace/small bilateral effusions. 3. Scattered ill-defined areas of ground-glass within the right upper lobe, potentially representative of alveolar pulmonary edema though conceivably early infection, including atypical etiologies, could have a similar appearance. 4. Mildly enlarged mediastinal and prominent bilateral hilar and thoracic inlet lymph nodes are nonspecific though favored to be reactive in etiology. 5. Mild  nodularity of the hepatic contour, nonspecific though could be seen in the setting of cirrhosis. Correlation with LFTs is advised. Electronically Signed   By: Simonne Come M.D.   On: 04/28/2023 12:29   DG Chest 2 View  Result Date: 04/28/2023 CLINICAL DATA:  Two-week history of nonproductive cough associated with several days of shortness of breath EXAM: CHEST - 2 VIEW COMPARISON:  Chest radiograph dated 09/29/2009 FINDINGS: Normal lung volumes. Bilateral peribronchial wall thickening and interstitial opacities. No pleural effusion or pneumothorax. Enlarged cardiomediastinal silhouette. No acute osseous abnormality. IMPRESSION: 1. Bilateral peribronchial wall thickening and interstitial opacities, which can be seen in the setting of bronchitis or pulmonary edema. 2. Mild cardiomegaly, new compared to 09/29/2009. Recommend correlation with EKG and echocardiography as clinically indicated. Electronically Signed   By: Agustin Cree M.D.   On: 04/28/2023 09:30    Pending Labs Unresulted Labs (From admission, onward)     Start     Ordered   04/29/23 0500  Basic metabolic panel  Tomorrow morning,   R        04/28/23 1330   04/29/23 0500  CBC  Tomorrow morning,   R        04/28/23 1330   04/28/23 1329  HIV Antibody (routine testing w rflx)  (HIV Antibody (Routine testing w reflex) panel)  Once,   R        04/28/23 1330            Vitals/Pain Today's Vitals   04/28/23 2030 04/28/23 2100 04/28/23 2130 04/28/23 2200  BP: (!) 161/112 (!) 178/116 (!) 169/116 (!) 181/122  Pulse: 95 (!) 105 95 98  Resp: (!) 30 (!) 29 (!) 22 (!) 25  Temp: 98.1 F (36.7 C)     TempSrc:      SpO2: 97% 98% 95% 94%  Weight:      Height:      PainSc:        Isolation Precautions No active isolations  Medications Medications  amLODipine (NORVASC) tablet 10 mg (10 mg Oral Given 04/28/23 1438)  melatonin tablet 2.5 mg (has no administration in time range)  enoxaparin (LOVENOX) injection 40 mg (has no administration  in time range)  acetaminophen (TYLENOL) tablet 650 mg (has no administration in time range)    Or  acetaminophen (TYLENOL) suppository 650 mg (has no administration in time range)  ondansetron (ZOFRAN) tablet 4 mg (has no administration in time range)    Or  ondansetron (ZOFRAN) injection 4 mg (has no administration in time range)  albuterol (PROVENTIL) (2.5 MG/3ML) 0.083% nebulizer solution 2.5  mg (has no administration in time range)  furosemide (LASIX) injection 40 mg (has no administration in time range)  hydrALAZINE (APRESOLINE) injection 5 mg (5 mg Intravenous Given 04/28/23 1918)  iohexol (OMNIPAQUE) 350 MG/ML injection 75 mL (75 mLs Intravenous Contrast Given 04/28/23 1112)  furosemide (LASIX) injection 40 mg (40 mg Intravenous Given 04/28/23 1303)  potassium chloride (KLOR-CON) packet 20 mEq (20 mEq Oral Given 04/28/23 1303)    Mobility walks

## 2023-04-28 NOTE — ED Provider Notes (Signed)
[place] EMERGENCY DEPARTMENT AT [facility] Provider Note  CSN: [id number] Arrival date & time: 04/28/23 5621  Chief Complaint(s) Shortness of Breath  HPI Poet Stute is a 47 y.o. male with history of high blood pressure presenting to the emergency department shortness of breath.  Patient reports shortness of breath for the past 2 weeks has been worsening.  Reports associated nonproductive cough.  Shortness of breath worse with exertion.  Reports chills, no fever.  No chest pain.  No lightheadedness, dizziness, fainting.  No recent travel or surgeries.  No history of DVT/PE.  No sore throat, runny nose.  No nausea, vomiting.  Does have some orthopnea.  Past Medical History History reviewed. No pertinent past medical history. Patient Active Problem List   Diagnosis Date Noted   Acute CHF (congestive heart failure) (HCC) 04/28/2023   Major depressive disorder, single episode, severe (HCC) 11/04/2020   HERPES LABIALIS 04/27/2011   RESPIRATORY DISORDER, ACUTE 04/27/2011   Home Medication(s) Prior to Admission medications   Medication Sig Start Date End Date Taking? Authorizing Provider  Melatonin 1 MG CAPS Take 2 mg by mouth at bedtime as needed (sleep).   Yes [provider]  Phenylephrine-Pheniramine-DM [facility] COLD & COUGH PO) Take 1 Dose by mouth as needed (cough).   Yes [provider]  Pseudoeph-Doxylamine-DM-APAP (NYQUIL PO) Take 2 Doses by mouth at bedtime as needed (sleep/cough).   Yes [provider]  amLODipine (NORVASC) 10 MG tablet Take 1 tablet (10 mg total) by mouth daily. Patient not taking: Reported on 04/28/2023 06/01/22   Raspet, Noberto Retort, PA-C                                                                                                                                    Past Surgical History Past Surgical History:  Procedure Laterality Date   APPENDECTOMY     Family History Family History  Problem Relation Age of Onset    Bipolar disorder Mother    Heart attack Father     Social History Social History   Tobacco Use   Smoking status: Former    Passive exposure: Never   Smokeless tobacco: Never  Vaping Use   Vaping status: Never Used  Substance Use Topics   Alcohol use: Never   Drug use: Never   Allergies Codeine  Review of Systems Review of Systems  All other systems reviewed and are negative.   Physical Exam Vital Signs  I have reviewed the triage vital signs BP (!) 177/122   Pulse 95   Temp 97.8 F (36.6 C) (Oral)   Resp (!) 25   Ht 5\' 10"  (1.778 m)   Wt 79.4 kg   SpO2 92%   BMI 25.11 kg/m  Physical Exam Vitals and nursing note reviewed.  Constitutional:      General: He is not in acute distress.    Appearance: Normal appearance.  HENT:  Mouth/Throat:     Mouth: Mucous membranes are moist.     Pharynx: No pharyngeal swelling.  Eyes:     Conjunctiva/sclera: Conjunctivae normal.  Cardiovascular:     Rate and Rhythm: Normal rate and regular rhythm.  Pulmonary:     Effort: Pulmonary effort is normal. No respiratory distress.     Breath sounds: Examination of the left-lower field reveals rales. Rales present.  Abdominal:     General: Abdomen is flat.     Palpations: Abdomen is soft.     Tenderness: There is no abdominal tenderness.  Musculoskeletal:     Right lower leg: No tenderness. No edema.     Left lower leg: No tenderness. No edema.  Skin:    General: Skin is warm and dry.     Capillary Refill: Capillary refill takes less than 2 seconds.  Neurological:     Mental Status: He is alert and oriented to person, place, and time. Mental status is at baseline.  Psychiatric:        Mood and Affect: Mood normal.        Behavior: Behavior normal.     ED Results and Treatments Labs (all labs ordered are listed, but only abnormal results are displayed) Labs Reviewed  COMPREHENSIVE METABOLIC PANEL - Abnormal; Notable for the following components:      Result Value    Glucose, Bld 116 (*)    Creatinine, Ser 1.35 (*)    All other components within normal limits  BRAIN NATRIURETIC PEPTIDE - Abnormal; Notable for the following components:   B Natriuretic Peptide 1,261.5 (*)    All other components within normal limits  D-DIMER, QUANTITATIVE - Abnormal; Notable for the following components:   D-Dimer, Quant 2.25 (*)    All other components within normal limits  CBC WITH DIFFERENTIAL/PLATELET  HIV ANTIBODY (ROUTINE TESTING W REFLEX)                                                                                                                          Radiology CT Angio Chest PE W/Cm &/Or Wo Cm  Result Date: 04/28/2023 CLINICAL DATA:  Positive D-dimer. Shortness of breath. Evaluate for pulmonary embolism. EXAM: CT ANGIOGRAPHY CHEST WITH CONTRAST TECHNIQUE: Multidetector CT imaging of the chest was performed using the standard protocol during bolus administration of intravenous contrast. Multiplanar CT image reconstructions and MIPs were obtained to evaluate the vascular anatomy. RADIATION DOSE REDUCTION: This exam was performed according to the departmental dose-optimization program which includes automated exposure control, adjustment of the mA and/or kV according to patient size and/or use of iterative reconstruction technique. CONTRAST:  75mL OMNIPAQUE IOHEXOL 350 MG/ML SOLN COMPARISON:  Chest radiograph earlier same day FINDINGS: Vascular Findings: There is adequate opacification of the pulmonary arterial system with the main pulmonary artery measuring 517 Hounsfield units. There are no discrete filling defects within the pulmonary arterial tree to suggest pulmonary embolism. Normal caliber of the main pulmonary artery. Cardiomegaly.  No pericardial effusion.  No evidence of thoracic aortic aneurysm or dissection on this nongated examination. Conventional configuration of the aortic arch. The branch vessels of the aortic arch appear patent throughout their imaged  courses. Review of the MIP images confirms the above findings. ---------------------------------------------------------------------------------- Nonvascular Findings: Mediastinum/Lymph Nodes: Mediastinal lymphadenopathy with index right-sided paratracheal lymph node measuring 1.3 cm in diameter (53, series 4). Additional scattered prevascular, hilar and thoracic inlet lymph nodes are numerous and prominent though individually nonenlarged by size criteria. No axillary lymphadenopathy. Lungs/Pleura: Scattered ill-defined areas ground-glass within the right upper lobe (representative images 54 and 60, series 12), without associated air bronchograms. Trace/small bilateral effusions with associated bibasilar ground-glass atelectasis. There is mild thickening of the pulmonary interstitium. No air bronchograms. No pneumothorax. The central pulmonary airways appear patent. Upper abdomen: Limited early arterial phase evaluation of the upper abdomen demonstrates mild nodularity of the hepatic contour. Musculoskeletal: Regional soft tissues appear normal. Normal appearance of the thyroid gland. No acute or aggressive osseous abnormalities. DDD at C6-C7 with disc space height loss endplate irregularity and sclerosis. 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 IMPRESSION: 1. No evidence of pulmonary embolism. 2. Findings suggestive of pulmonary edema including cardiomegaly, mild thickening of the pulmonary interstitium and trace/small bilateral effusions. 3. Scattered ill-defined areas of ground-glass within the right upper lobe, potentially representative of alveolar pulmonary edema though conceivably early infection, including atypical etiologies, could have a similar appearance. 4. Mildly enlarged mediastinal and prominent bilateral hilar and thoracic inlet lymph nodes are nonspecific though favored to be reactive in etiology. 5. Mild nodularity of the hepatic contour, nonspecific though could be seen in  the setting of cirrhosis. Correlation with LFTs is advised. Electronically Signed   By: Simonne Come M.D.   On: 04/28/2023 12:29   DG Chest 2 View  Result Date: 04/28/2023 CLINICAL DATA:  Two-week history of nonproductive cough associated with several days of shortness of breath EXAM: CHEST - 2 VIEW COMPARISON:  Chest radiograph dated 09/29/2009 FINDINGS: Normal lung volumes. Bilateral peribronchial wall thickening and interstitial opacities. No pleural effusion or pneumothorax. Enlarged cardiomediastinal silhouette. No acute osseous abnormality. IMPRESSION: 1. Bilateral peribronchial wall thickening and interstitial opacities, which can be seen in the setting of bronchitis or pulmonary edema. 2. Mild cardiomegaly, new compared to 09/29/2009. Recommend correlation with EKG and echocardiography as clinically indicated. Electronically Signed   By: Agustin Cree M.D.   On: 04/28/2023 09:30    Pertinent labs & imaging results that were available during my care of the patient were reviewed by me and considered in my medical decision making (see MDM for details).  Medications Ordered in ED Medications  amLODipine (NORVASC) tablet 10 mg (has no administration in time range)  melatonin tablet 2.5 mg (has no administration in time range)  enoxaparin (LOVENOX) injection 40 mg (has no administration in time range)  acetaminophen (TYLENOL) tablet 650 mg (has no administration in time range)    Or  acetaminophen (TYLENOL) suppository 650 mg (has no administration in time range)  ondansetron (ZOFRAN) tablet 4 mg (has no administration in time range)    Or  ondansetron (ZOFRAN) injection 4 mg (has no administration in time range)  albuterol (PROVENTIL) (2.5 MG/3ML) 0.083% nebulizer solution 2.5 mg (has no administration in time range)  furosemide (LASIX) injection 40 mg (has no administration in time range)  iohexol (OMNIPAQUE) 350 MG/ML injection 75 mL (75 mLs Intravenous Contrast Given 04/28/23 1112)  furosemide  (LASIX) injection 40 mg (40 mg Intravenous Given 04/28/23 1303)  potassium chloride (KLOR-CON) packet 20 mEq (20 mEq Oral Given 04/28/23 1303)                                                                                                                                     Procedures Procedures  (including critical care time)  Medical Decision Making / ED Course   MDM:  47 year old male presenting to the emergency department with shortness of breath.  Patient overall well-appearing, vital signs with borderline tachycardia.  Afebrile.  Does have some left lower lobe crackles concerning for pneumonia.  Suspect primary cause is most likely pneumonia.  Will check chest x-ray and basic labs.  Patient not hypoxic.  Anticipate likely discharge home with oral antibiotics.  Lower concern for alternative process such as pulmonary embolism, no risk factors, pleuritic pain.  No wheezing to suggest asthma or COPD.  Patient appears euvolemic, doubt CHF.  No significant chest pain to suggest ACS.  Patient is hypertensive, has not been taking his amlodipine recently, we will represcribe this for him.  Clinical Course as of 04/28/23 1334  Mon Apr 28, 2023  0932 CXR with possible bronchitis, given pulmonary findings suspect bronchopneumonia. CXR also with some cardiomegaly, will add BNP and d-dimer [WS]  1032 D-dimer elevated will proceed with CTA chest [WS]  1252 CTA chest with no evidence of PE, does show cardiomegaly, pulmonary edema.  Will give Lasix.  Patient borderline hypoxic.  Given new onset and relatively young patient, will admit patient.  Discussed the hospitalist will admit the patient. [WS]    Clinical Course User Index [WS] Lonell Grandchild, MD     Additional history obtained: -External records from outside source obtained and reviewed including: Chart review including previous notes, labs, imaging, consultation notes including prior ER visits   Lab Tests: -I ordered, reviewed, and  interpreted labs.   The pertinent results include:   Labs Reviewed  COMPREHENSIVE METABOLIC PANEL - Abnormal; Notable for the following components:      Result Value   Glucose, Bld 116 (*)    Creatinine, Ser 1.35 (*)    All other components within normal limits  BRAIN NATRIURETIC PEPTIDE - Abnormal; Notable for the following components:   B Natriuretic Peptide 1,261.5 (*)    All other components within normal limits  D-DIMER, QUANTITATIVE - Abnormal; Notable for the following components:   D-Dimer, Quant 2.25 (*)    All other components within normal limits  CBC WITH DIFFERENTIAL/PLATELET  HIV ANTIBODY (ROUTINE TESTING W REFLEX)    Notable for elevated d-dimer, elevated BNP  EKG   EKG Interpretation Date/Time:  Monday April 28 2023 08:44:25 EDT Ventricular Rate:  103 PR Interval:  168 QRS Duration:  104 QT Interval:  374 QTC Calculation: 490 R Axis:   47  Text Interpretation: Sinus tachycardia Probable left atrial enlargement Borderline T abnormalities, diffuse leads Borderline prolonged QT interval Confirmed by Alvino Blood ([id number]) on 04/28/2023  9:16:08 AM         Imaging Studies ordered: I ordered imaging studies including CTA chest On my interpretation imaging demonstrates no PE, cardiomegaly present I independently visualized and interpreted imaging. I agree with the radiologist interpretation   Medicines ordered and prescription drug management: Meds ordered this encounter  Medications   iohexol (OMNIPAQUE) 350 MG/ML injection 75 mL   furosemide (LASIX) injection 40 mg   potassium chloride (KLOR-CON) packet 20 mEq   amLODipine (NORVASC) tablet 10 mg   melatonin tablet 2.5 mg   enoxaparin (LOVENOX) injection 40 mg   OR Linked Order Group    acetaminophen (TYLENOL) tablet 650 mg    acetaminophen (TYLENOL) suppository 650 mg   OR Linked Order Group    ondansetron (ZOFRAN) tablet 4 mg    ondansetron (ZOFRAN) injection 4 mg   albuterol (PROVENTIL)  (2.5 MG/3ML) 0.083% nebulizer solution 2.5 mg   furosemide (LASIX) injection 40 mg    -I have reviewed the patients home medicines and have made adjustments as needed   Consultations Obtained: I requested consultation with the hospitalist,  and discussed lab and imaging findings as well as pertinent plan - they recommend: admission   Cardiac Monitoring: The patient was maintained on a cardiac monitor.  I personally viewed and interpreted the cardiac monitored which showed an underlying rhythm of: NSR  Reevaluation: After the interventions noted above, I reevaluated the patient and found that their symptoms have improved  Co morbidities that complicate the patient evaluation History reviewed. No pertinent past medical history.    Dispostion: Disposition decision including need for hospitalization was considered, and patient admitted to the hospital.    Final Clinical Impression(s) / ED Diagnoses Final diagnoses:  Acute congestive heart failure, unspecified heart failure type [facility])     This chart was dictated using voice recognition software.  Despite best efforts to proofread,  errors can occur which can change the documentation meaning.    Lonell Grandchild, MD 04/28/23 [phone]

## 2023-04-29 ENCOUNTER — Inpatient Hospital Stay (HOSPITAL_COMMUNITY): Payer: Medicaid Other

## 2023-04-29 DIAGNOSIS — J40 Bronchitis, not specified as acute or chronic: Secondary | ICD-10-CM

## 2023-04-29 DIAGNOSIS — I5041 Acute combined systolic (congestive) and diastolic (congestive) heart failure: Secondary | ICD-10-CM

## 2023-04-29 DIAGNOSIS — E876 Hypokalemia: Secondary | ICD-10-CM | POA: Insufficient documentation

## 2023-04-29 DIAGNOSIS — N179 Acute kidney failure, unspecified: Secondary | ICD-10-CM

## 2023-04-29 DIAGNOSIS — I10 Essential (primary) hypertension: Secondary | ICD-10-CM

## 2023-04-29 DIAGNOSIS — I159 Secondary hypertension, unspecified: Secondary | ICD-10-CM

## 2023-04-29 DIAGNOSIS — I160 Hypertensive urgency: Secondary | ICD-10-CM

## 2023-04-29 LAB — CBC
HCT: 39.8 % (ref 39.0–52.0)
Hemoglobin: 13.4 g/dL (ref 13.0–17.0)
MCH: 30.3 pg (ref 26.0–34.0)
MCHC: 33.7 g/dL (ref 30.0–36.0)
MCV: 90 fL (ref 80.0–100.0)
Platelets: 346 10*3/uL (ref 150–400)
RBC: 4.42 MIL/uL (ref 4.22–5.81)
RDW: 14.1 % (ref 11.5–15.5)
WBC: 9 10*3/uL (ref 4.0–10.5)
nRBC: 0 % (ref 0.0–0.2)

## 2023-04-29 LAB — BASIC METABOLIC PANEL
Anion gap: 10 (ref 5–15)
BUN: 21 mg/dL — ABNORMAL HIGH (ref 6–20)
CO2: 24 mmol/L (ref 22–32)
Calcium: 8.7 mg/dL — ABNORMAL LOW (ref 8.9–10.3)
Chloride: 100 mmol/L (ref 98–111)
Creatinine, Ser: 1.23 mg/dL (ref 0.61–1.24)
GFR, Estimated: >60 mL/min (ref >60)
Glucose, Bld: 117 mg/dL — ABNORMAL HIGH (ref 70–99)
Potassium: 3 mmol/L — ABNORMAL LOW (ref 3.5–5.1)
Sodium: 134 mmol/L — ABNORMAL LOW (ref 135–145)

## 2023-04-29 LAB — ECHOCARDIOGRAM COMPLETE
AR max vel: 1.72 cm2
AV Area VTI: 1.66 cm2
AV Area mean vel: 1.58 cm2
AV Mean grad: 5 mmHg
AV Peak grad: 8 mmHg
Ao pk vel: 1.41 m/s
Area-P 1/2: 5.88 cm2
Height: 70 in
S' Lateral: 5.7 cm
Weight: 2800 [oz_av]

## 2023-04-29 LAB — RAPID URINE DRUG SCREEN, HOSP PERFORMED
Amphetamines: NOT DETECTED
Barbiturates: NOT DETECTED
Benzodiazepines: NOT DETECTED
Cocaine: POSITIVE — AB
Opiates: NOT DETECTED
Tetrahydrocannabinol: NOT DETECTED

## 2023-04-29 MED ORDER — PERFLUTREN LIPID MICROSPHERE
1.0000 mL | INTRAVENOUS | Status: AC | PRN
  Administered 2023-04-29: 2 mL via INTRAVENOUS

## 2023-04-29 MED ORDER — DEXTROMETHORPHAN POLISTIREX ER 30 MG/5ML PO SUER
60.0000 mg | Freq: Two times a day (BID) | ORAL | Status: DC | PRN
  Administered 2023-04-30: 60 mg via ORAL
  Filled 2023-04-29 (×2): qty 10

## 2023-04-29 MED ORDER — LABETALOL HCL 5 MG/ML IV SOLN
10.0000 mg | INTRAVENOUS | Status: DC | PRN
  Administered 2023-04-29: 10 mg via INTRAVENOUS
  Filled 2023-04-29: qty 4

## 2023-04-29 MED ORDER — LORATADINE 10 MG PO TABS
10.0000 mg | ORAL_TABLET | Freq: Every day | ORAL | Status: DC
  Administered 2023-04-29 – 2023-05-01 (×3): 10 mg via ORAL
  Filled 2023-04-29 (×3): qty 1

## 2023-04-29 MED ORDER — CARVEDILOL 6.25 MG PO TABS
6.2500 mg | ORAL_TABLET | Freq: Two times a day (BID) | ORAL | Status: DC
  Administered 2023-04-29 – 2023-05-01 (×4): 6.25 mg via ORAL
  Filled 2023-04-29 (×4): qty 1

## 2023-04-29 MED ORDER — SALINE SPRAY 0.65 % NA SOLN
1.0000 | NASAL | Status: DC | PRN
  Administered 2023-04-30: 1 via NASAL
  Filled 2023-04-29: qty 44

## 2023-04-29 MED ORDER — DIPHENHYDRAMINE HCL 25 MG PO CAPS
50.0000 mg | ORAL_CAPSULE | Freq: Every day | ORAL | Status: DC
  Administered 2023-04-29 – 2023-04-30 (×2): 50 mg via ORAL
  Filled 2023-04-29 (×2): qty 2

## 2023-04-29 MED ORDER — EMPAGLIFLOZIN 10 MG PO TABS
10.0000 mg | ORAL_TABLET | Freq: Every day | ORAL | Status: DC
  Administered 2023-04-29 – 2023-05-01 (×3): 10 mg via ORAL
  Filled 2023-04-29 (×3): qty 1

## 2023-04-29 MED ORDER — SACUBITRIL-VALSARTAN 49-51 MG PO TABS
1.0000 | ORAL_TABLET | Freq: Two times a day (BID) | ORAL | Status: DC
  Administered 2023-04-29 – 2023-05-01 (×5): 1 via ORAL
  Filled 2023-04-29 (×6): qty 1

## 2023-04-29 MED ORDER — HYDRALAZINE HCL 25 MG PO TABS: 25.0000 mg | ORAL_TABLET | ORAL | Status: DC | PRN

## 2023-04-29 MED ORDER — POTASSIUM CHLORIDE CRYS ER 20 MEQ PO TBCR
40.0000 meq | EXTENDED_RELEASE_TABLET | ORAL | Status: AC
  Administered 2023-04-29 (×2): 40 meq via ORAL
  Filled 2023-04-29 (×2): qty 2

## 2023-04-29 NOTE — Assessment & Plan Note (Signed)
-   Few labs for comparison but suspect acute worsening in setting of uncontrolled hypertension - Creatinine has improved since admission but will need to continue to be trended - at risk for CKD with uncontrolled HTN as well

## 2023-04-29 NOTE — Assessment & Plan Note (Signed)
-   suspect URI - check RVP - has been using decongestants at home; informed to avoid pseudophed containing drugs in setting of HTN - trial of claritin and benadryl  - saline nasal spary PRN

## 2023-04-29 NOTE — Assessment & Plan Note (Signed)
-   presents with DOE/SOB. Found to have pleural effusions, trace LE edema, pulmonary edema - BNP 1,261 - image studies consistent with volume overload/CHF - echo: EF 30-35%, grade 3 DD. LV global hypokinesis, dilated LV. Dilated RA and LA. Dilated IVC - cardiology consulted given new CHF; etiology possibly hypertensive heart disease vs ruling out ischemia (outpatient workup planned) - GDMT being started inpatient; he has no insurance nor PCP and will need to make sure he can afford meds prior to d/c and has PCP set up

## 2023-04-29 NOTE — Hospital Course (Addendum)
Matthew Warren is a 47 yo male with PMH HTN, med noncompliance due to financial constraints who is admitted with acute SOB.  He has been off of amlodipine for approximately 4 months due to inability to afford and also does not have a PCP anymore.  He had also described some mild chest pain with exertion prior to admission. He has also had a suspected upper respiratory infection for approximately a couple weeks and has been using over-the-counter cough syrups notably containing pseudoephedrine.  On workup in the ER he was found to be significantly hypertensive, 188/142. Notable labs included: BNP 1,261. Elevated dimer 2.25. Creat 1.33 (no recent labs for comparison). CXR showed underlying bronchitis and/or pulmonary edema.  CT angio chest negative for PE and shows pulmonary edema including cardiomegaly and bilateral small pleural effusions.  Also scattered groundglass opacities and enlarged mediastinal and hilar lymph nodes considered reactive.  He was admitted for blood pressure control, obtaining echo, and diuresis.  Cardiology also consulted after abnormal echo findings.  Interim History  He is currently continue to get diuresed and cardiology is planning on transitioning from IV diuretics to oral diuretics 05/01/2023.  He was transitioned to oral diuretics and cardiology signed off the case.  The [facility] team and the pharmacy were able to get him his medications.  He will need to follow-up with PCP and cardiology outpatient setting and have outpatient coronary CTA done.  Assessment and Plan:  * Acute combined systolic and diastolic congestive heart failure (HCC) -Presents with DOE/SOB. Found to have pleural effusions, trace LE edema, pulmonary edema -BNP 1,261 -Image studies consistent with volume overload/CHF -Echo: EF 30-35%, grade 3 DD. LV global hypokinesis, dilated LV. Dilated RA and LA. Dilated IVC -Strict I's and O's and daily weights; No intake or output data in the 24 hours ending 05/03/23  1736  Filed Weights   04/28/23 0848  Weight: 79.4 kg  -Cardiology consulted given new CHF; etiology possibly hypertensive heart disease vs ruling out ischemia (outpatient workup planned) -GDMT being started inpatient; he has no insurance nor PCP and will need to make sure he can afford meds prior to d/c and has PCP set up -See below for medications -Patient was getting IV diuresis and is now being changed to p.o. tomorrow -Continue with Empagliflozin 10 mg p.o. daily and cardiology is adding spironolactone 25 mg p.o. daily today -Cardiology recommending outpatient coronary CTA to exclude CAD -Given his improvement he is deemed stable for discharge and cardiology recommended the medications and we have asked the [facility] and pharmacy to get him his medications   Hypertensive urgency -Initial BP 188/142; remained elevated and now significantly improved -Has been restarted on home amlodipine and GDMT as delineated  -Continue PRN labetalol and hydralazine too  -Checked UDS and was Positive for Cocaine   HTN (hypertension) -Final regimen to be determined prior to discharge -Continue with amlodipine 10 mg p.o. daily, carvedilol 6.25 mg p.o. twice daily with meals, and spironolactone 25 mg p.o. daily as well as subacute show-valsartan 49-51 mg per tab 1 tab p.o. twice daily -Continue with as needed blood pressure medications including IV labetalol 10 mg Q4 as needed for systolic blood pressure greater than 160 or diastolic blood pressure greater than 100 and p.o. hydralazine 25 mg every 4 as needed as a second as needed option if unable to use labetalol -Continue to monitor blood pressures per protocol -Follow-up with PCP and cardiology in outpatient setting  AKI (acute kidney injury) (HCC) -Few labs for comparison but  suspect acute worsening in setting of uncontrolled hypertension -BUN/Cr Trend: Recent Labs  Lab 04/28/23 0940 04/29/23 0425 04/30/23 0430 05/01/23 0449  BUN 19 21* 19 28*   CREATININE 1.35* 1.23 1.17 1.38*  -Avoid Nephrotoxic Medications, Contrast Dyes, Hypotension and Dehydration to Ensure Adequate Renal Perfusion and will need to Renally Adjust Meds -Continue to Monitor and Trend Renal Function carefully  -At risk for CKD with uncontrolled HTN as well -Follow-up with PCP and repeat CMP within 1 week   Bronchitis, improving  -Suspect URI -Check RVP this was negative -Has been using decongestants at home; informed to avoid pseudophed containing drugs in setting of HTN -WBC Trend: Recent Labs  Lab 04/28/23 0940 04/29/23 0425 05/01/23 0449 05/01/23 1135  WBC 10.1 9.0 13.0* 12.1*  -Continue Trial of claritin and benadryl  -Saline nasal spary PRN -Checked chest x-ray and showed "Cardiac shadow is stable. Lungs are well aerated bilaterally. Previously seen peribronchial thickening has resolved. No bony abnormality is noted." -WBC elevated likely reactive and also in the setting of diuresis but trending down and patient stable  Erythrocytosis -In the setting of him being diuresed as Hgb/Hct Trend: Recent Labs  Lab 04/28/23 0940 04/29/23 0425 05/01/23 0449 05/01/23 1135  HGB 13.3 13.4 17.4* 17.6*  HCT 40.0 39.8 53.4* 53.8*  MCV 89.9 90.0 91.4 91.2  -Check anemia panel in outpatient setting -Continue to monitor for signs of bleeding; no overt bleeding noted and will need to follow-up in outpatient setting within 1 week  Cocaine Abuse -UDS was positive -Continue counseling and recommend avoiding totally   Hypokalemia -Patient's K+ Level Trend: Recent Labs  Lab 04/28/23 0940 04/29/23 0425 04/30/23 0430 05/01/23 0449  K 3.5 3.0* 3.8 4.4  -Continue to Monitor and Replete as Necessary -Repeat CMP in the AM   Suspected OSA -Will need outpatient sleep study  HLD -Patient's Lipid Panel done and showed a Total Cholesterol/HDL Ratio of 5.6, cholesterol level was 298, HDL 53, LDL is 180, triglycerides was 323, VLDL 65 -Continue with Rosuvastatin  20 mg p.o. daily

## 2023-04-29 NOTE — Consult Note (Addendum)
Cardiology Consultation   Patient ID: Matthew Warren MRN: [id number]; DOB: Feb 25, 1976  Admit date: 04/28/2023 Date of Consult: 04/29/2023  PCP:  Patient, No Pcp Per   [facility] Providers Cardiologist:  None   {   Patient Profile:   Matthew Warren is a 47 y.o. male with a hx of hypertension (recently noncompliant) who is being seen 04/29/2023 for the evaluation of new onset HFrEF at the request of Dr. Frederick Peers.  History of Present Illness:   Matthew Warren patient without any known cardiac history.  He does have a father who died in his 69s due to heart attack.  He also has a identical twin who recently suffered from a stroke.  He does not follow consistently with a PCP.  Denies any history of diabetes.  Used to smoke about half a pack a day for 10 years.  Did do cocaine for about 4 years between 84 to 59 years old.  He does have history of uncontrolled blood pressure and had not been taking his medications because he stated that he felt okay.  He also does drink quite a bit on the weekends (6 beers and sometimes a couple shots)  Currently patient is being admitted for shortness of breath.  On admission he was noted to have elevated BNP 1261, uncontrolled hypertension with blood pressure in the 180s systolic, pulmonary edema noted on CT. he has been started on IV Lasix 40 mg with improvement in symptoms and shortness of breath and cough.  Echocardiogram obtained and shows newly reduced EF 30 to 35% with normal RV function.  Prior to admission he denied any issues with swallowing orthopnea.  He did however state that on Sunday while carrying a 2 x 4 at the store he had abrupt onset of chest pain without any associated symptoms that relieved itself after he quit walking.  He had no prior complaints of chest pain prior to this.  Patient still complaining of a cough that has improved some, otherwise shortness of breath/cough that has improved.  Potassium 3, BUN 21, calcium 8.7.  BNP  1200+, glucose 117.  Creatinine on admission 1.35.  Creatinine 1.2.  Was tachycardic on admission as well.  Negative PE.  History reviewed. No pertinent past medical history.  Past Surgical History:  Procedure Laterality Date   APPENDECTOMY       Inpatient Medications: Scheduled Meds:  amLODipine  10 mg Oral Daily   diphenhydrAMINE  50 mg Oral QHS   enoxaparin (LOVENOX) injection  40 mg Subcutaneous Q24H   furosemide  40 mg Intravenous Daily   potassium chloride  40 mEq Oral Q4H   Continuous Infusions:  PRN Meds: acetaminophen **OR** acetaminophen, albuterol, hydrALAZINE, labetalol, melatonin, ondansetron **OR** ondansetron (ZOFRAN) IV  Allergies:    Allergies  Allergen Reactions   Codeine Other (See Comments)    Unknown.    Social History:   Social History   Socioeconomic History   Marital status: Single    Spouse name: Not on file   Number of children: Not on file   Years of education: Not on file   Highest education level: Not on file  Occupational History   Not on file  Tobacco Use   Smoking status: Former    Passive exposure: Never   Smokeless tobacco: Never  Vaping Use   Vaping status: Never Used  Substance and Sexual Activity   Alcohol use: Never   Drug use: Never   Sexual activity: Not on file  Other Topics  Concern   Not on file  Social History Narrative   Not on file   Social Determinants of Health   Financial Resource Strain: Not on file  Food Insecurity: Food Insecurity Present (04/29/2023)   Hunger Vital Sign    Worried About Running Out of Food in the Last Year: Sometimes true    Ran Out of Food in the Last Year: Never true  Transportation Needs: No Transportation Needs (04/29/2023)   PRAPARE - Administrator, Civil Service (Medical): No    Lack of Transportation (Non-Medical): No  Physical Activity: Not on file  Stress: Not on file  Social Connections: Unknown (12/25/2021)   Received from [facility]   Social  Network    Social Network: Not on file  Intimate Partner Violence: Not At Risk (04/29/2023)   Humiliation, Afraid, Rape, and Kick questionnaire    Fear of Current or Ex-Partner: No    Emotionally Abused: No    Physically Abused: No    Sexually Abused: No    Family History:   Family History  Problem Relation Age of Onset   Bipolar disorder Mother    Heart attack Father      ROS:  Please see the history of present illness. All other ROS reviewed and negative.     Physical Exam/Data:   Vitals:   04/29/23 0409 04/29/23 0530 04/29/23 0613 04/29/23 0912  BP: (!) 167/123 (!) 163/121 (!) 172/110 (!) 159/108  Pulse: 98   98  Resp: (!) 22   18  Temp: 98.4 F (36.9 C)     TempSrc: Oral     SpO2: 95%   98%  Weight:      Height:        Intake/Output Summary (Last 24 hours) at 04/29/2023 1227 Last data filed at 04/29/2023 0600 Gross per 24 hour  Intake 480 ml  Output 950 ml  Net -470 ml      04/28/2023    8:48 AM 05/31/2022    5:45 PM 04/27/2011   12:17 PM  Last 3 Weights  Weight (lbs) 175 lb 180 lb 175 lb  Weight (kg) 79.379 kg 81.647 kg 79.379 kg     Body mass index is 25.11 kg/m.  General:  Well nourished, well developed, in no acute distress HEENT: normal Neck: no JVD Vascular: No carotid bruits; Distal pulses 2+ bilaterally Cardiac:  normal S1, S2; RRR; no murmur  Lungs:  clear to auscultation bilaterally, no wheezing, rhonchi or rales  Abd: soft, nontender, no hepatomegaly  Ext: no edema Musculoskeletal:  No deformities, BUE and BLE strength normal and equal Skin: warm and dry  Neuro:  CNs 2-12 intact, no focal abnormalities noted Psych:  Normal affect   EKG:  The EKG was personally reviewed and demonstrates: Sinus tachycardia, heart rate 103.  IVCD Telemetry:  Telemetry was personally reviewed and demonstrates: Normal sinus rhythm heart rate 90s  Relevant CV Studies: Echocardiogram 04/29/2023 1. Left ventricular ejection fraction, by estimation, is 30 to 35%.  The  left ventricle has moderately decreased function. The left ventricle  demonstrates global hypokinesis. The left ventricular internal cavity size  was moderately dilated. Left  ventricular diastolic parameters are consistent with Grade III diastolic  dysfunction (restrictive). Elevated left atrial pressure.   2. Right ventricular systolic function is normal. The right ventricular  size is normal. Tricuspid regurgitation signal is inadequate for assessing  PA pressure.   3. Left atrial size was mildly dilated.   4. Right  atrial size was mildly dilated.   5. The mitral valve is normal in structure. Trivial mitral valve  regurgitation.   6. The aortic valve is tricuspid. Aortic valve regurgitation is not  visualized. No aortic stenosis is present.   7. The inferior vena cava is dilated in size with <50% respiratory  variability, suggesting right atrial pressure of 15 mmHg.   Laboratory Data:  High Sensitivity Troponin:  No results for input(s): "TROPONINIHS" in the last 720 hours.   Chemistry Recent Labs  Lab 04/28/23 0940 04/29/23 0425  NA 136 134*  K 3.5 3.0*  CL 104 100  CO2 24 24  GLUCOSE 116* 117*  BUN 19 21*  CREATININE 1.35* 1.23  CALCIUM 8.9 8.7*  GFRNONAA >60 >60  ANIONGAP 8 10    Recent Labs  Lab 04/28/23 0940  PROT 7.2  ALBUMIN 3.6  AST 22  ALT 31  ALKPHOS 113  BILITOT 0.9   Lipids No results for input(s): "CHOL", "TRIG", "HDL", "LABVLDL", "LDLCALC", "CHOLHDL" in the last 168 hours.  Hematology Recent Labs  Lab 04/28/23 0940 04/29/23 0425  WBC 10.1 9.0  RBC 4.45 4.42  HGB 13.3 13.4  HCT 40.0 39.8  MCV 89.9 90.0  MCH 29.9 30.3  MCHC 33.3 33.7  RDW 13.9 14.1  PLT 333 346   Thyroid No results for input(s): "TSH", "FREET4" in the last 168 hours.  BNP Recent Labs  Lab 04/28/23 0940  BNP 1,261.5*    DDimer  Recent Labs  Lab 04/28/23 0940  DDIMER 2.25*     Radiology/Studies:  ECHOCARDIOGRAM COMPLETE  Result Date: 04/29/2023     ECHOCARDIOGRAM REPORT   Patient Name:   Matthew Warren Date of Exam: 04/29/2023 Medical Rec #:  [id number]        Height:       70.0 in Accession #:    [id number]       Weight:       175.0 lb Date of Birth:  [birth year]        BSA:          1.972 m Patient Age:    47 years         BP:           172/110 mmHg Patient Gender: M                HR:           95 bpm. Exam Location:  Inpatient Procedure: 2D Echo, Cardiac Doppler, Color Doppler and Intracardiac            Opacification Agent Indications:    CHF  History:        Patient has no prior history of Echocardiogram examinations.                 Risk Factors:Hypertension.  Sonographer:    Darlys Gales Referring Phys: [id number] [facility] IMPRESSIONS  1. Left ventricular ejection fraction, by estimation, is 30 to 35%. The left ventricle has moderately decreased function. The left ventricle demonstrates global hypokinesis. The left ventricular internal cavity size was moderately dilated. Left ventricular diastolic parameters are consistent with Grade III diastolic dysfunction (restrictive). Elevated left atrial pressure.  2. Right ventricular systolic function is normal. The right ventricular size is normal. Tricuspid regurgitation signal is inadequate for assessing PA pressure.  3. Left atrial size was mildly dilated.  4. Right atrial size was mildly dilated.  5. The mitral valve is normal in structure. Trivial mitral valve regurgitation.  6. The aortic valve is tricuspid. Aortic valve regurgitation is not visualized. No aortic stenosis is present.  7. The inferior vena cava is dilated in size with <50% respiratory variability, suggesting right atrial pressure of 15 mmHg. FINDINGS  Left Ventricle: Left ventricular ejection fraction, by estimation, is 30 to 35%. The left ventricle has moderately decreased function. The left ventricle demonstrates global hypokinesis. Definity contrast agent was given IV to delineate the left ventricular endocardial borders. The left  ventricular internal cavity size was moderately dilated. There is no left ventricular hypertrophy. Left ventricular diastolic parameters are consistent with Grade III diastolic dysfunction (restrictive). Elevated  left atrial pressure. Right Ventricle: The right ventricular size is normal. Right vetricular wall thickness was not well visualized. Right ventricular systolic function is normal. Tricuspid regurgitation signal is inadequate for assessing PA pressure. Left Atrium: Left atrial size was mildly dilated. Right Atrium: Right atrial size was mildly dilated. Pericardium: Trivial pericardial effusion is present. Mitral Valve: The mitral valve is normal in structure. Trivial mitral valve regurgitation. Tricuspid Valve: The tricuspid valve is normal in structure. Tricuspid valve regurgitation is trivial. Aortic Valve: The aortic valve is tricuspid. Aortic valve regurgitation is not visualized. No aortic stenosis is present. Aortic valve mean gradient measures 5.0 mmHg. Aortic valve peak gradient measures 8.0 mmHg. Aortic valve area, by VTI measures 1.66 cm. Pulmonic Valve: The pulmonic valve was grossly normal. Pulmonic valve regurgitation is trivial. Aorta: The aortic root is normal in size and structure. Venous: The inferior vena cava is dilated in size with less than 50% respiratory variability, suggesting right atrial pressure of 15 mmHg. IAS/Shunts: The interatrial septum was not well visualized.  LEFT VENTRICLE PLAX 2D LVIDd:         6.30 cm   Diastology LVIDs:         5.70 cm   LV e' medial:    3.81 cm/s LV PW:         1.30 cm   LV E/e' medial:  31.8 LV IVS:        0.70 cm   LV e' lateral:   9.14 cm/s LVOT diam:     1.90 cm   LV E/e' lateral: 13.2 LV SV:         43 LV SV Index:   22 LVOT Area:     2.84 cm  RIGHT VENTRICLE RV S prime:     10.80 cm/s TAPSE (M-mode): 1.5 cm LEFT ATRIUM             Index        RIGHT ATRIUM           Index LA Vol (A2C):   59.4 ml 30.12 ml/m  RA Area:     22.60 cm LA Vol  (A4C):   79.5 ml 40.31 ml/m  RA Volume:   72.50 ml  36.76 ml/m LA Biplane Vol: 69.2 ml 35.09 ml/m  AORTIC VALVE AV Area (Vmax):    1.72 cm AV Area (Vmean):   1.58 cm AV Area (VTI):     1.66 cm AV Vmax:           141.00 cm/s AV Vmean:          112.000 cm/s AV VTI:            0.256 m AV Peak Grad:      8.0 mmHg AV Mean Grad:      5.0 mmHg LVOT Vmax:         85.50 cm/s LVOT  Vmean:        62.300 cm/s LVOT VTI:          0.150 m LVOT/AV VTI ratio: 0.59 MITRAL VALVE MV Area (PHT): 5.88 cm     SHUNTS MV Decel Time: 129 msec     Systemic VTI:  0.15 m MV E velocity: 121.00 cm/s  Systemic Diam: 1.90 cm MV A velocity: 43.70 cm/s MV E/A ratio:  2.77 Epifanio Lesches MD Electronically signed by Epifanio Lesches MD Signature Date/Time: 04/29/2023/10:55:15 AM    Final    CT Angio Chest PE W/Cm &/Or Wo Cm  Result Date: 04/28/2023 CLINICAL DATA:  Positive D-dimer. Shortness of breath. Evaluate for pulmonary embolism. EXAM: CT ANGIOGRAPHY CHEST WITH CONTRAST TECHNIQUE: Multidetector CT imaging of the chest was performed using the standard protocol during bolus administration of intravenous contrast. Multiplanar CT image reconstructions and MIPs were obtained to evaluate the vascular anatomy. RADIATION DOSE REDUCTION: This exam was performed according to the departmental dose-optimization program which includes automated exposure control, adjustment of the mA and/or kV according to patient size and/or use of iterative reconstruction technique. CONTRAST:  75mL OMNIPAQUE IOHEXOL 350 MG/ML SOLN COMPARISON:  Chest radiograph earlier same day FINDINGS: Vascular Findings: There is adequate opacification of the pulmonary arterial system with the main pulmonary artery measuring 517 Hounsfield units. There are no discrete filling defects within the pulmonary arterial tree to suggest pulmonary embolism. Normal caliber of the main pulmonary artery. Cardiomegaly.  No pericardial effusion. No evidence of thoracic aortic aneurysm  or dissection on this nongated examination. Conventional configuration of the aortic arch. The branch vessels of the aortic arch appear patent throughout their imaged courses. Review of the MIP images confirms the above findings. ---------------------------------------------------------------------------------- Nonvascular Findings: Mediastinum/Lymph Nodes: Mediastinal lymphadenopathy with index right-sided paratracheal lymph node measuring 1.3 cm in diameter (53, series 4). Additional scattered prevascular, hilar and thoracic inlet lymph nodes are numerous and prominent though individually nonenlarged by size criteria. No axillary lymphadenopathy. Lungs/Pleura: Scattered ill-defined areas ground-glass within the right upper lobe (representative images 54 and 60, series 12), without associated air bronchograms. Trace/small bilateral effusions with associated bibasilar ground-glass atelectasis. There is mild thickening of the pulmonary interstitium. No air bronchograms. No pneumothorax. The central pulmonary airways appear patent. Upper abdomen: Limited early arterial phase evaluation of the upper abdomen demonstrates mild nodularity of the hepatic contour. Musculoskeletal: Regional soft tissues appear normal. Normal appearance of the thyroid gland. No acute or aggressive osseous abnormalities. DDD at C6-C7 with disc space height loss endplate irregularity and sclerosis. 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 IMPRESSION: 1. No evidence of pulmonary embolism. 2. Findings suggestive of pulmonary edema including cardiomegaly, mild thickening of the pulmonary interstitium and trace/small bilateral effusions. 3. Scattered ill-defined areas of ground-glass within the right upper lobe, potentially representative of alveolar pulmonary edema though conceivably early infection, including atypical etiologies, could have a similar appearance. 4. Mildly enlarged mediastinal and prominent bilateral hilar  and thoracic inlet lymph nodes are nonspecific though favored to be reactive in etiology. 5. Mild nodularity of the hepatic contour, nonspecific though could be seen in the setting of cirrhosis. Correlation with LFTs is advised. Electronically Signed   By: Simonne Come M.D.   On: 04/28/2023 12:29   DG Chest 2 View  Result Date: 04/28/2023 CLINICAL DATA:  Two-week history of nonproductive cough associated with several days of shortness of breath EXAM: CHEST - 2 VIEW COMPARISON:  Chest radiograph dated 09/29/2009 FINDINGS: Normal lung volumes. Bilateral peribronchial wall thickening  and interstitial opacities. No pleural effusion or pneumothorax. Enlarged cardiomediastinal silhouette. No acute osseous abnormality. IMPRESSION: 1. Bilateral peribronchial wall thickening and interstitial opacities, which can be seen in the setting of bronchitis or pulmonary edema. 2. Mild cardiomegaly, new compared to 09/29/2009. Recommend correlation with EKG and echocardiography as clinically indicated. Electronically Signed   By: Agustin Cree M.D.   On: 04/28/2023 09:30     Assessment and Plan:   New onset acute HFrEF Has elevated BNP 1200+, pulmonary edema, shortness of breath now improved after IV Lasix 40 mg.  Diuresed about 950 L since being admitted yesterday.  Echocardiogram shows EF 30 to 35% with grade 3 diastolic dysfunction.  Normal RV function.  PTA no real complaints until SOB recently. Unclear of etiology of his heart failure given that he does have family history of heart disease, previous drug use, uncontrolled hypertension, recent episode of chest pain.  Will plan for outpatient coronary CTA that can be done outpatient to try to assess for potential ischemic causes. May consider catherization at some point if EF does not improve with GDMT and better BP control.  Will start on carvedilol 6.25 mg twice daily. Start Jardiance 10mg  and Entresto 49-51mg  BID Continue IV lasix 40mg  today.  Overall looks euvolemic but  did have some signs of volume on echocardiogram.  Also has improved after Lasix yesterday and still has cough. Checking lipids tomorrow.   Hypokalemia Continue to replete as necessary. 3.0  Hypertension Poorly controlled with systolics in the 180s.  PTA was on amlodipine and was noncompliant with this.  Continue amlodipine along with GDMT as above.  CKD Borderline.  Initially had elevated creatinine 1.3 on admission, normal GFR.  Likely a result of his uncontrolled hypertension.  Continue to monitor.  Suspected OSA  Needs sleep study outpatient.       Risk Assessment/Risk Scores:   New York Heart Association (NYHA) Functional Class NYHA Class II        For questions or updates, please contact [facility] Please consult [email or web address] for contact info under    Signed, Abagail Kitchens, PA-C  04/29/2023 12:27 PM

## 2023-04-29 NOTE — Assessment & Plan Note (Signed)
-   Final regimen to be determined prior to discharge

## 2023-04-29 NOTE — Assessment & Plan Note (Addendum)
-   initial BP 188/142; remained elevated - has been restarted on home amlodipine and GDMT as per above - continue PRN labetalol and hydralazine too  - check UDS

## 2023-04-29 NOTE — Assessment & Plan Note (Signed)
-   replete as needed

## 2023-04-29 NOTE — Progress Notes (Signed)
Progress Note    Matthew Warren   ZOX:[id number]  DOB: [birth year]  DOA: 04/28/2023     1 PCP: Patient, No Pcp Per  Initial CC: SOB  Hospital Course: Matthew Warren is a 47 yo male with PMH HTN, med noncompliance due to financial constraints who is admitted with acute SOB.  He has been off of amlodipine for approximately 4 months due to inability to afford and also does not have a PCP anymore.  He had also described some mild chest pain with exertion prior to admission. He has also had a suspected upper respiratory infection for approximately a couple weeks and has been using over-the-counter cough syrups notably containing pseudoephedrine.  On workup in the ER he was found to be significantly hypertensive, 188/142. Notable labs included: BNP 1,261. Elevated dimer 2.25. Creat 1.33 (no recent labs for comparison). CXR showed underlying bronchitis and/or pulmonary edema.  CT angio chest negative for PE and shows pulmonary edema including cardiomegaly and bilateral small pleural effusions.  Also scattered groundglass opacities and enlarged mediastinal and hilar lymph nodes considered reactive.  He was admitted for blood pressure control, obtaining echo, and diuresis.  Cardiology also consulted after abnormal echo findings.  Interval History:  Resting in bed when seen this morning appearing congested with mild erythema noted in face.  Endorsed taking cough syrups and decongestants prior to admission.  Has not been able to afford amlodipine in about 4 months and has no PCP.  He was not feeling bad after stopping amlodipine therefore did not try to refill.  Assessment and Plan: * Acute combined systolic and diastolic congestive heart failure (HCC) - presents with DOE/SOB. Found to have pleural effusions, trace LE edema, pulmonary edema - BNP 1,261 - image studies consistent with volume overload/CHF - echo: EF 30-35%, grade 3 DD. LV global hypokinesis, dilated LV. Dilated RA and LA. Dilated  IVC - cardiology consulted given new CHF; etiology possibly hypertensive heart disease vs ruling out ischemia (outpatient workup planned) - GDMT being started inpatient; he has no insurance nor PCP and will need to make sure he can afford meds prior to d/c and has PCP set up  Hypertensive urgency - initial BP 188/142; remained elevated - has been restarted on home amlodipine and GDMT as per above - continue PRN labetalol and hydralazine too  - check UDS  HTN (hypertension) - Final regimen to be determined prior to discharge  AKI (acute kidney injury) (HCC) - Few labs for comparison but suspect acute worsening in setting of uncontrolled hypertension - Creatinine has improved since admission but will need to continue to be trended - at risk for CKD with uncontrolled HTN as well  Bronchitis - suspect URI - check RVP - has been using decongestants at home; informed to avoid pseudophed containing drugs in setting of HTN - trial of claritin and benadryl  - saline nasal spary PRN  Hypokalemia - replete as needed   Old records reviewed in assessment of this patient  Antimicrobials:   DVT prophylaxis:  enoxaparin (LOVENOX) injection 40 mg Start: 04/28/23 2200 SCDs Start: 04/28/23 1330   Code Status:   Code Status: Full Code  Mobility Assessment (Last 72 Hours)     Mobility Assessment     Row Name 04/28/23 23:43:21           Does patient have an order for bedrest or is patient medically unstable No - Continue assessment       What is the highest level of mobility based on  the progressive mobility assessment? Level 6 (Walks independently in room and hall) - Balance while walking in room without assist - Complete                Barriers to discharge: none Disposition Plan:  Home Status is: Inpt  Objective: Blood pressure (!) 159/108, pulse 98, temperature 98.4 F (36.9 C), temperature source Oral, resp. rate 18, height 5\' 10"  (1.778 m), weight 79.4 kg, SpO2 98%.   Examination:  Physical Exam Constitutional:      General: He is not in acute distress.    Appearance: Normal appearance.  HENT:     Head: Normocephalic and atraumatic.     Mouth/Throat:     Mouth: Mucous membranes are moist.  Eyes:     Extraocular Movements: Extraocular movements intact.  Cardiovascular:     Rate and Rhythm: Normal rate and regular rhythm.  Pulmonary:     Effort: Pulmonary effort is normal. No respiratory distress.     Breath sounds: Normal breath sounds. No wheezing.  Abdominal:     General: Bowel sounds are normal. There is no distension.     Palpations: Abdomen is soft.     Tenderness: There is no abdominal tenderness.  Musculoskeletal:        General: Swelling (trace LE edema) present. Normal range of motion.     Cervical back: Normal range of motion and neck supple.  Skin:    General: Skin is warm and dry.  Neurological:     General: No focal deficit present.     Mental Status: He is alert.  Psychiatric:        Mood and Affect: Mood normal.        Behavior: Behavior normal.      Consultants:  Cardiology  Procedures:    Data Reviewed: Results for orders placed or performed during the hospital encounter of 04/28/23 (from the past 24 hour(s))  HIV Antibody (routine testing w rflx)     Status: None   Collection Time: 04/28/23  5:44 PM  Result Value Ref Range   HIV Screen 4th Generation wRfx Non Reactive Non Reactive  Basic metabolic panel     Status: Abnormal   Collection Time: 04/29/23  4:25 AM  Result Value Ref Range   Sodium 134 (L) 135 - 145 mmol/L   Potassium 3.0 (L) 3.5 - 5.1 mmol/L   Chloride 100 98 - 111 mmol/L   CO2 24 22 - 32 mmol/L   Glucose, Bld 117 (H) 70 - 99 mg/dL   BUN 21 (H) 6 - 20 mg/dL   Creatinine, Ser 0.27 0.61 - 1.24 mg/dL   Calcium 8.7 (L) 8.9 - 10.3 mg/dL   GFR, Estimated >25 >36 mL/min   Anion gap 10 5 - 15  CBC     Status: None   Collection Time: 04/29/23  4:25 AM  Result Value Ref Range   WBC 9.0 4.0 - 10.5  K/uL   RBC 4.42 4.22 - 5.81 MIL/uL   Hemoglobin 13.4 13.0 - 17.0 g/dL   HCT 64.4 03.4 - 74.2 %   MCV 90.0 80.0 - 100.0 fL   MCH 30.3 26.0 - 34.0 pg   MCHC 33.7 30.0 - 36.0 g/dL   RDW 59.5 63.8 - 75.6 %   Platelets 346 150 - 400 K/uL   nRBC 0.0 0.0 - 0.2 %    I have reviewed pertinent nursing notes, vitals, labs, and images as necessary. I have ordered labwork to follow up on as  indicated.  I have reviewed the last notes from staff over past 24 hours. I have discussed patient's care plan and test results with nursing staff, CM/SW, and other staff as appropriate.  Time spent: Greater than 50% of the 55 minute visit was spent in counseling/coordination of care for the patient as laid out in the A&P.   LOS: 1 day   Lewie Chamber, MD [facility] 04/29/2023, 1:04 PM

## 2023-04-29 NOTE — Plan of Care (Signed)

## 2023-04-29 NOTE — Plan of Care (Signed)
  Problem: Education: Goal: Knowledge of General Education information will improve Description: Including pain rating scale, medication(s)/side effects and non-pharmacologic comfort measures Outcome: Progressing   Problem: Clinical Measurements: Goal: Cardiovascular complication will be avoided Outcome: Not Progressing

## 2023-04-29 NOTE — ED Notes (Signed)
Verbal order given to given PRN dose at this time

## 2023-04-29 NOTE — ED Notes (Signed)
Patient transported to floor at this time.

## 2023-04-30 ENCOUNTER — Encounter (HOSPITAL_COMMUNITY): Payer: Self-pay | Admitting: Internal Medicine

## 2023-04-30 ENCOUNTER — Inpatient Hospital Stay (HOSPITAL_COMMUNITY): Payer: Medicaid Other

## 2023-04-30 DIAGNOSIS — E876 Hypokalemia: Secondary | ICD-10-CM

## 2023-04-30 MED ORDER — SPIRONOLACTONE 25 MG PO TABS
25.0000 mg | ORAL_TABLET | Freq: Every day | ORAL | Status: DC
  Administered 2023-04-30 – 2023-05-01 (×2): 25 mg via ORAL
  Filled 2023-04-30 (×2): qty 1

## 2023-04-30 MED ORDER — ROSUVASTATIN CALCIUM 20 MG PO TABS
20.0000 mg | ORAL_TABLET | Freq: Every day | ORAL | Status: DC
  Administered 2023-04-30 – 2023-05-01 (×2): 20 mg via ORAL
  Filled 2023-04-30 (×2): qty 1

## 2023-04-30 MED ORDER — FUROSEMIDE 40 MG PO TABS
40.0000 mg | ORAL_TABLET | Freq: Every day | ORAL | Status: DC
  Administered 2023-05-01: 40 mg via ORAL
  Filled 2023-04-30: qty 1

## 2023-04-30 NOTE — Progress Notes (Signed)
Patient Name: Matthew Warren Date of Encounter: 04/30/2023 [facility] Cardiologist: Chrystie Nose, MD   Interval Summary  .    Feel significantly better today without a cough or any shortness of breath.  Did not have overt edema however he says that he can feel the tightness and the swelling go down.  Vital Signs .    Vitals:   04/30/23 0003 04/30/23 0408 04/30/23 0834 04/30/23 0853  BP: 129/82 114/80 (!) 130/96 (!) 130/99  Pulse: 83 85 89 92  Resp: 20 20 20    Temp: 98 F (36.7 C) 97.9 F (36.6 C) 97.9 F (36.6 C)   TempSrc: Oral Oral Oral   SpO2: 94% 91% 94%   Weight:      Height:        Intake/Output Summary (Last 24 hours) at 04/30/2023 1405 Last data filed at 04/30/2023 1213 Gross per 24 hour  Intake 555 ml  Output 550 ml  Net 5 ml      04/28/2023    8:48 AM 05/31/2022    5:45 PM 04/27/2011   12:17 PM  Last 3 Weights  Weight (lbs) 175 lb 180 lb 175 lb  Weight (kg) 79.379 kg 81.647 kg 79.379 kg      Telemetry/ECG    Normal sinus rhythm heart rate between 90-100- Personally Reviewed  CV Studies    Echocardiogram 04/29/2023 1. Left ventricular ejection fraction, by estimation, is 30 to 35%. The  left ventricle has moderately decreased function. The left ventricle  demonstrates global hypokinesis. The left ventricular internal cavity size  was moderately dilated. Left  ventricular diastolic parameters are consistent with Grade III diastolic  dysfunction (restrictive). Elevated left atrial pressure.   2. Right ventricular systolic function is normal. The right ventricular  size is normal. Tricuspid regurgitation signal is inadequate for assessing  PA pressure.   3. Left atrial size was mildly dilated.   4. Right atrial size was mildly dilated.   5. The mitral valve is normal in structure. Trivial mitral valve  regurgitation.   6. The aortic valve is tricuspid. Aortic valve regurgitation is not  visualized. No aortic stenosis is present.    7. The inferior vena cava is dilated in size with <50% respiratory  variability, suggesting right atrial pressure of 15 mmHg.   Physical Exam .   GEN: No acute distress.   Neck: + JVD Cardiac: RRR, no murmurs, rubs, or gallops.  Respiratory: Clear to auscultation bilaterally. GI: Soft, nontender, non-distended  MS: No edema  Patient Profile    Matthew Warren is a 47 y.o. male has hx of hypertension (recently noncompliant)  and admitted on 04/29/2023 for the evaluation of   Assessment & Plan .     New onset acute HFrEF Had elevated BNP 1200+, pulmonary edema, shortness of breath now improved after IV Lasix 40 mg daily.   Echocardiogram shows EF 30 to 35% with grade 3 diastolic dysfunction.  Normal RV function.  PTA no real complaints until SOB recently. Unclear of etiology of his heart failure given that he does have family history of heart disease, previous drug use, uncontrolled hypertension, recent episode of chest pain.  Will plan for outpatient coronary CTA that can be done outpatient to try to assess for potential ischemic causes. May consider catherization at some point if EF does not improve with GDMT and better BP control.   Significant improvement after IV diuretics.  No longer coughing or short of breath.  I's and O's are  not documented accurately.  Likely can transition to oral diuretics tomorrow, possible discharge.   Continue carvedilol 6.25 mg twice daily, Jardiance 10mg  and Entresto 49-51mg  BID.  Will add spironolactone 25 mg today.   Hypokalemia Has normalized today  Hypertension On admission poorly controlled with systolics in the 180s.  PTA was on amlodipine and was noncompliant with this.  With titration of GDMT as above significantly improved blood pressures in the 130s systolic.   CKD Borderline.  Creatinine has actually normalized after diuresis.   Suspected OSA  Needs sleep study outpatient.   Hyperlipidemia This admission cholesterol 298, HDL 53, LDL  180, glycerides 323.  Reports eating a lot of gas station food.  Will start him on rosuvastatin 20 mg with plans to uptitrate if he tolerates.  + Cocaine Previously had reported use between age of 78 and 39.  Discussed with patient today and he continued to deny use.  Reiterated importance of avoiding these types of drugs  For questions or updates, please contact [facility] Please consult [email or web address] for contact info under        Signed, Abagail Kitchens, PA-C

## 2023-04-30 NOTE — Progress Notes (Signed)
PROGRESS NOTE    Matthew Warren  DGU:[id number] DOB: 05-12-1976 DOA: 04/28/2023 PCP: Patient, No Pcp Per   Brief Narrative:  Matthew Warren is a 47 yo male with PMH HTN, med noncompliance due to financial constraints who is admitted with acute SOB.  He has been off of amlodipine for approximately 4 months due to inability to afford and also does not have a PCP anymore.  He had also described some mild chest pain with exertion prior to admission. He has also had a suspected upper respiratory infection for approximately a couple weeks and has been using over-the-counter cough syrups notably containing pseudoephedrine.  On workup in the ER he was found to be significantly hypertensive, 188/142. Notable labs included: BNP 1,261. Elevated dimer 2.25. Creat 1.33 (no recent labs for comparison). CXR showed underlying bronchitis and/or pulmonary edema.  CT angio chest negative for PE and shows pulmonary edema including cardiomegaly and bilateral small pleural effusions.  Also scattered groundglass opacities and enlarged mediastinal and hilar lymph nodes considered reactive.  He was admitted for blood pressure control, obtaining echo, and diuresis.  Cardiology also consulted after abnormal echo findings.  Interim History  He is currently continue to get diuresed and cardiology is planning on transitioning from IV diuretics to oral diuretics tomorrow.  Anticipating discharge in the next 24 to 48 hours if can obtain his medications.   Assessment and Plan:  * Acute combined systolic and diastolic congestive heart failure (HCC) -Presents with DOE/SOB. Found to have pleural effusions, trace LE edema, pulmonary edema -BNP 1,261 -Image studies consistent with volume overload/CHF -Echo: EF 30-35%, grade 3 DD. LV global hypokinesis, dilated LV. Dilated RA and LA. Dilated IVC -Strict I's and O's and daily weights;  Intake/Output Summary (Last 24 hours) at 04/30/2023 1615 Last data filed at 04/30/2023  1415 Gross per 24 hour  Intake 675 ml  Output 550 ml  Net 125 ml   Filed Weights   04/28/23 0848  Weight: 79.4 kg  -Cardiology consulted given new CHF; etiology possibly hypertensive heart disease vs ruling out ischemia (outpatient workup planned) -GDMT being started inpatient; he has no insurance nor PCP and will need to make sure he can afford meds prior to d/c and has PCP set up -See below for medications -Patient was getting IV diuresis and is now being changed to p.o. tomorrow -Continue with Empagliflozin 10 mg p.o. daily and cardiology is adding spironolactone 25 mg p.o. daily today -Cardiology recommending outpatient coronary CTA to exclude CAD   Hypertensive urgency -Initial BP 188/142; remained elevated -Has been restarted on home amlodipine and GDMT as delineated  -Continue PRN labetalol and hydralazine too  -Checked UDS and was Positive for Cocaine   HTN (hypertension) -Final regimen to be determined prior to discharge -Continue with amlodipine 10 mg p.o. daily, carvedilol 6.25 mg p.o. twice daily with meals, and spironolactone 25 mg p.o. daily as well as subacute show-valsartan 49-51 mg per tab 1 tab p.o. twice daily -Continue with as needed blood pressure medications including IV labetalol 10 mg Q4 as needed for systolic blood pressure greater than 160 or diastolic blood pressure greater than 100 and p.o. hydralazine 25 mg every 4 as needed as a second as needed option if unable to use labetalol -Continue to monitor blood pressures per protocol -Last blood pressure reading was slightly elevated at 139  AKI (acute kidney injury) (HCC) -Few labs for comparison but suspect acute worsening in setting of uncontrolled hypertension -BUN/Cr Trend: Recent Labs  Lab 04/28/23 0940  04/29/23 0425 04/30/23 0430  BUN 19 21* 19  CREATININE 1.35* 1.23 1.17  -Avoid Nephrotoxic Medications, Contrast Dyes, Hypotension and Dehydration to Ensure Adequate Renal Perfusion and will need to  Renally Adjust Meds -Continue to Monitor and Trend Renal Function carefully and repeat CMP in the AM given that he is getting diuresed -At risk for CKD with uncontrolled HTN as well   Bronchitis -Suspect URI -Check RVP this was negative -Has been using decongestants at home; informed to avoid pseudophed containing drugs in setting of HTN -Continue Trial of claritin and benadryl  -Saline nasal spary PRN -Check chest x-ray now  Cocaine Abuse -UDS was positive -Continue counseling   Hypokalemia -Patient's K+ Level Trend: Recent Labs  Lab 04/28/23 0940 04/29/23 0425 04/30/23 0430  K 3.5 3.0* 3.8  -Continue to Monitor and Replete as Necessary -Repeat CMP in the AM   Suspected OSA -Will need outpatient sleep study  HLD -Patient's Lipid Panel done and showed a Total Cholesterol/HDL Ratio of 5.6, cholesterol level was 298, HDL 53, LDL is 180, triglycerides was 323, VLDL 65 -Continue with rosuvastatin 20 mg p.o. daily   DVT prophylaxis: enoxaparin (LOVENOX) injection 40 mg Start: 04/28/23 2200 SCDs Start: 04/28/23 1330    Code Status: Full Code Family Communication: No family currently at bedside  Disposition Plan:  Level of care: Telemetry Status is: Inpatient Remains inpatient appropriate because: Needs further clinical improvement and clearance by cardiology prior to discharging   Consultants:  Cardiology  Procedures:  ECHOCARDIOGRAM IMPRESSIONS     1. Left ventricular ejection fraction, by estimation, is 30 to 35%. The  left ventricle has moderately decreased function. The left ventricle  demonstrates global hypokinesis. The left ventricular internal cavity size  was moderately dilated. Left  ventricular diastolic parameters are consistent with Grade III diastolic  dysfunction (restrictive). Elevated left atrial pressure.   2. Right ventricular systolic function is normal. The right ventricular  size is normal. Tricuspid regurgitation signal is inadequate for  assessing  PA pressure.   3. Left atrial size was mildly dilated.   4. Right atrial size was mildly dilated.   5. The mitral valve is normal in structure. Trivial mitral valve  regurgitation.   6. The aortic valve is tricuspid. Aortic valve regurgitation is not  visualized. No aortic stenosis is present.   7. The inferior vena cava is dilated in size with <50% respiratory  variability, suggesting right atrial pressure of 15 mmHg.   FINDINGS   Left Ventricle: Left ventricular ejection fraction, by estimation, is 30  to 35%. The left ventricle has moderately decreased function. The left  ventricle demonstrates global hypokinesis. Definity contrast agent was  given IV to delineate the left  ventricular endocardial borders. The left ventricular internal cavity size  was moderately dilated. There is no left ventricular hypertrophy. Left  ventricular diastolic parameters are consistent with Grade III diastolic  dysfunction (restrictive). Elevated   left atrial pressure.   Right Ventricle: The right ventricular size is normal. Right vetricular  wall thickness was not well visualized. Right ventricular systolic  function is normal. Tricuspid regurgitation signal is inadequate for  assessing PA pressure.   Left Atrium: Left atrial size was mildly dilated.   Right Atrium: Right atrial size was mildly dilated.   Pericardium: Trivial pericardial effusion is present.   Mitral Valve: The mitral valve is normal in structure. Trivial mitral  valve regurgitation.   Tricuspid Valve: The tricuspid valve is normal in structure. Tricuspid  valve regurgitation is trivial.  Aortic Valve: The aortic valve is tricuspid. Aortic valve regurgitation is  not visualized. No aortic stenosis is present. Aortic valve mean gradient  measures 5.0 mmHg. Aortic valve peak gradient measures 8.0 mmHg. Aortic  valve area, by VTI measures 1.66  cm.   Pulmonic Valve: The pulmonic valve was grossly normal.  Pulmonic valve  regurgitation is trivial.   Aorta: The aortic root is normal in size and structure.   Venous: The inferior vena cava is dilated in size with less than 50%  respiratory variability, suggesting right atrial pressure of 15 mmHg.   IAS/Shunts: The interatrial septum was not well visualized.     LEFT VENTRICLE  PLAX 2D  LVIDd:         6.30 cm   Diastology  LVIDs:         5.70 cm   LV e' medial:    3.81 cm/s  LV PW:         1.30 cm   LV E/e' medial:  31.8  LV IVS:        0.70 cm   LV e' lateral:   9.14 cm/s  LVOT diam:     1.90 cm   LV E/e' lateral: 13.2  LV SV:         43  LV SV Index:   22  LVOT Area:     2.84 cm     RIGHT VENTRICLE  RV S prime:     10.80 cm/s  TAPSE (M-mode): 1.5 cm   LEFT ATRIUM             Index        RIGHT ATRIUM           Index  LA Vol (A2C):   59.4 ml 30.12 ml/m  RA Area:     22.60 cm  LA Vol (A4C):   79.5 ml 40.31 ml/m  RA Volume:   72.50 ml  36.76 ml/m  LA Biplane Vol: 69.2 ml 35.09 ml/m   AORTIC VALVE  AV Area (Vmax):    1.72 cm  AV Area (Vmean):   1.58 cm  AV Area (VTI):     1.66 cm  AV Vmax:           141.00 cm/s  AV Vmean:          112.000 cm/s  AV VTI:            0.256 m  AV Peak Grad:      8.0 mmHg  AV Mean Grad:      5.0 mmHg  LVOT Vmax:         85.50 cm/s  LVOT Vmean:        62.300 cm/s  LVOT VTI:          0.150 m  LVOT/AV VTI ratio: 0.59   MITRAL VALVE  MV Area (PHT): 5.88 cm     SHUNTS  MV Decel Time: 129 msec     Systemic VTI:  0.15 m  MV E velocity: 121.00 cm/s  Systemic Diam: 1.90 cm  MV A velocity: 43.70 cm/s  MV E/A ratio:  2.77   Antimicrobials:  Anti-infectives (From admission, onward)    None       Subjective: Seen and examined at bedside and he is feeling better.  Thinks he still feels slightly short of breath but states his cough is improved.  No nausea or vomiting.  Denies any lightheadedness or dizziness.  No other concerns or complaints this  time.  Objective: Vitals:   04/30/23 0003  04/30/23 0408 04/30/23 0834 04/30/23 0853  BP: 129/82 114/80 (!) 130/96 (!) 130/99  Pulse: 83 85 89 92  Resp: 20 20 20    Temp: 98 F (36.7 C) 97.9 F (36.6 C) 97.9 F (36.6 C)   TempSrc: Oral Oral Oral   SpO2: 94% 91% 94%   Weight:      Height:        Intake/Output Summary (Last 24 hours) at 04/30/2023 1617 Last data filed at 04/30/2023 1415 Gross per 24 hour  Intake 675 ml  Output 550 ml  Net 125 ml   Filed Weights   04/28/23 0848  Weight: 79.4 kg   Examination: Physical Exam:  Constitutional: WN/WD overweight Caucasian male in no acute distress Respiratory: Diminished to auscultation bilaterally with some coarse breath sounds and has some slight crackles.  No appreciable wheezing, rales or rhonchi.  Has a normal respiratory effort and is not wearing any supplemental oxygen via nasal cannula Cardiovascular: RRR, no murmurs / rubs / gallops. S1 and S2 auscultated.  Minimal extremity edema Abdomen: Soft, non-tender, mildly distended secondary to body habitus. Bowel sounds positive.  GU: Deferred. Musculoskeletal: No clubbing / cyanosis of digits/nails. No joint deformity upper and lower extremities. Skin: No rashes, lesions, ulcers limited skin evaluation. No induration; Warm and dry.  Neurologic: CN 2-12 grossly intact with no focal deficits. Romberg sign and cerebellar reflexes not assessed.  Psychiatric: Normal judgment and insight. Alert and oriented x 3. Normal mood and appropriate affect.   Data Reviewed: I have personally reviewed following labs and imaging studies  CBC: Recent Labs  Lab 04/28/23 0940 04/29/23 0425  WBC 10.1 9.0  NEUTROABS 7.1  --   HGB 13.3 13.4  HCT 40.0 39.8  MCV 89.9 90.0  PLT 333 346   Basic Metabolic Panel: Recent Labs  Lab 04/28/23 0940 04/29/23 0425 04/30/23 0430  NA 136 134* 136  K 3.5 3.0* 3.8  CL 104 100 103  CO2 24 24 23   GLUCOSE 116* 117* 115*  BUN 19 21* 19  CREATININE 1.35* 1.23 1.17  CALCIUM 8.9 8.7* 9.1  MG  --    --  2.6*   GFR: Estimated Creatinine Clearance: 82.3 mL/min (by C-G formula based on SCr of 1.17 mg/dL). Liver Function Tests: Recent Labs  Lab 04/28/23 0940  AST 22  ALT 31  ALKPHOS 113  BILITOT 0.9  PROT 7.2  ALBUMIN 3.6   No results for input(s): "LIPASE", "AMYLASE" in the last 168 hours. No results for input(s): "AMMONIA" in the last 168 hours. Coagulation Profile: No results for input(s): "INR", "PROTIME" in the last 168 hours. Cardiac Enzymes: No results for input(s): "CKTOTAL", "CKMB", "CKMBINDEX", "TROPONINI" in the last 168 hours. BNP (last 3 results) No results for input(s): "PROBNP" in the last 8760 hours. HbA1C: No results for input(s): "HGBA1C" in the last 72 hours. CBG: No results for input(s): "GLUCAP" in the last 168 hours. Lipid Profile: Recent Labs    04/30/23 0430  CHOL 298*  HDL 53  LDLCALC 180*  TRIG 323*  CHOLHDL 5.6   Thyroid Function Tests: No results for input(s): "TSH", "T4TOTAL", "FREET4", "T3FREE", "THYROIDAB" in the last 72 hours. Anemia Panel: No results for input(s): "VITAMINB12", "FOLATE", "FERRITIN", "TIBC", "IRON", "RETICCTPCT" in the last 72 hours. Sepsis Labs: No results for input(s): "PROCALCITON", "LATICACIDVEN" in the last 168 hours.  Recent Results (from the past 240 hour(s))  Respiratory (~20 pathogens) panel by PCR  Status: None   Collection Time: 04/29/23  8:30 PM  Result Value Ref Range Status   Adenovirus NOT DETECTED NOT DETECTED Final   Coronavirus 229E NOT DETECTED NOT DETECTED Final    Comment: (NOTE) The Coronavirus on the Respiratory Panel, DOES NOT test for the novel  Coronavirus (2019 nCoV)    Coronavirus HKU1 NOT DETECTED NOT DETECTED Final   Coronavirus NL63 NOT DETECTED NOT DETECTED Final   Coronavirus OC43 NOT DETECTED NOT DETECTED Final   Metapneumovirus NOT DETECTED NOT DETECTED Final   Rhinovirus / Enterovirus NOT DETECTED NOT DETECTED Final   Influenza A NOT DETECTED NOT DETECTED Final    Influenza B NOT DETECTED NOT DETECTED Final   Parainfluenza Virus 1 NOT DETECTED NOT DETECTED Final   Parainfluenza Virus 2 NOT DETECTED NOT DETECTED Final   Parainfluenza Virus 3 NOT DETECTED NOT DETECTED Final   Parainfluenza Virus 4 NOT DETECTED NOT DETECTED Final   Respiratory Syncytial Virus NOT DETECTED NOT DETECTED Final   Bordetella pertussis NOT DETECTED NOT DETECTED Final   Bordetella Parapertussis NOT DETECTED NOT DETECTED Final   Chlamydophila pneumoniae NOT DETECTED NOT DETECTED Final   Mycoplasma pneumoniae NOT DETECTED NOT DETECTED Final    Comment: Performed at [facility], [address] [place], [place] [address]    Radiology Studies: ECHOCARDIOGRAM COMPLETE  Result Date: 04/29/2023    ECHOCARDIOGRAM REPORT   Patient Name:   Matthew Warren Date of Exam: 04/29/2023 Medical Rec #:  [id number]        Height:       70.0 in Accession #:    [id number]       Weight:       175.0 lb Date of Birth:  [birth year]        BSA:          1.972 m Patient Age:    47 years         BP:           172/110 mmHg Patient Gender: M                HR:           95 bpm. Exam Location:  Inpatient Procedure: 2D Echo, Cardiac Doppler, Color Doppler and Intracardiac            Opacification Agent Indications:    CHF  History:        Patient has no prior history of Echocardiogram examinations.                 Risk Factors:Hypertension.  Sonographer:    Darlys Gales Referring Phys: [id number] [facility] IMPRESSIONS  1. Left ventricular ejection fraction, by estimation, is 30 to 35%. The left ventricle has moderately decreased function. The left ventricle demonstrates global hypokinesis. The left ventricular internal cavity size was moderately dilated. Left ventricular diastolic parameters are consistent with Grade III diastolic dysfunction (restrictive). Elevated left atrial pressure.  2. Right ventricular systolic function is normal. The right ventricular size is normal. Tricuspid regurgitation signal is  inadequate for assessing PA pressure.  3. Left atrial size was mildly dilated.  4. Right atrial size was mildly dilated.  5. The mitral valve is normal in structure. Trivial mitral valve regurgitation.  6. The aortic valve is tricuspid. Aortic valve regurgitation is not visualized. No aortic stenosis is present.  7. The inferior vena cava is dilated in size with <50% respiratory variability, suggesting right atrial pressure of 15 mmHg. FINDINGS  Left  Ventricle: Left ventricular ejection fraction, by estimation, is 30 to 35%. The left ventricle has moderately decreased function. The left ventricle demonstrates global hypokinesis. Definity contrast agent was given IV to delineate the left ventricular endocardial borders. The left ventricular internal cavity size was moderately dilated. There is no left ventricular hypertrophy. Left ventricular diastolic parameters are consistent with Grade III diastolic dysfunction (restrictive). Elevated  left atrial pressure. Right Ventricle: The right ventricular size is normal. Right vetricular wall thickness was not well visualized. Right ventricular systolic function is normal. Tricuspid regurgitation signal is inadequate for assessing PA pressure. Left Atrium: Left atrial size was mildly dilated. Right Atrium: Right atrial size was mildly dilated. Pericardium: Trivial pericardial effusion is present. Mitral Valve: The mitral valve is normal in structure. Trivial mitral valve regurgitation. Tricuspid Valve: The tricuspid valve is normal in structure. Tricuspid valve regurgitation is trivial. Aortic Valve: The aortic valve is tricuspid. Aortic valve regurgitation is not visualized. No aortic stenosis is present. Aortic valve mean gradient measures 5.0 mmHg. Aortic valve peak gradient measures 8.0 mmHg. Aortic valve area, by VTI measures 1.66 cm. Pulmonic Valve: The pulmonic valve was grossly normal. Pulmonic valve regurgitation is trivial. Aorta: The aortic root is normal in  size and structure. Venous: The inferior vena cava is dilated in size with less than 50% respiratory variability, suggesting right atrial pressure of 15 mmHg. IAS/Shunts: The interatrial septum was not well visualized.  LEFT VENTRICLE PLAX 2D LVIDd:         6.30 cm   Diastology LVIDs:         5.70 cm   LV e' medial:    3.81 cm/s LV PW:         1.30 cm   LV E/e' medial:  31.8 LV IVS:        0.70 cm   LV e' lateral:   9.14 cm/s LVOT diam:     1.90 cm   LV E/e' lateral: 13.2 LV SV:         43 LV SV Index:   22 LVOT Area:     2.84 cm  RIGHT VENTRICLE RV S prime:     10.80 cm/s TAPSE (M-mode): 1.5 cm LEFT ATRIUM             Index        RIGHT ATRIUM           Index LA Vol (A2C):   59.4 ml 30.12 ml/m  RA Area:     22.60 cm LA Vol (A4C):   79.5 ml 40.31 ml/m  RA Volume:   72.50 ml  36.76 ml/m LA Biplane Vol: 69.2 ml 35.09 ml/m  AORTIC VALVE AV Area (Vmax):    1.72 cm AV Area (Vmean):   1.58 cm AV Area (VTI):     1.66 cm AV Vmax:           141.00 cm/s AV Vmean:          112.000 cm/s AV VTI:            0.256 m AV Peak Grad:      8.0 mmHg AV Mean Grad:      5.0 mmHg LVOT Vmax:         85.50 cm/s LVOT Vmean:        62.300 cm/s LVOT VTI:          0.150 m LVOT/AV VTI ratio: 0.59 MITRAL VALVE MV Area (PHT): 5.88 cm     SHUNTS MV Decel Time:  129 msec     Systemic VTI:  0.15 m MV E velocity: 121.00 cm/s  Systemic Diam: 1.90 cm MV A velocity: 43.70 cm/s MV E/A ratio:  2.77 Epifanio Lesches MD Electronically signed by Epifanio Lesches MD Signature Date/Time: 04/29/2023/10:55:15 AM    Final     Scheduled Meds:  amLODipine  10 mg Oral Daily   carvedilol  6.25 mg Oral BID WC   diphenhydrAMINE  50 mg Oral QHS   empagliflozin  10 mg Oral Daily   enoxaparin (LOVENOX) injection  40 mg Subcutaneous Q24H   [START ON 05/01/2023] furosemide  40 mg Oral Daily   loratadine  10 mg Oral Daily   rosuvastatin  20 mg Oral Daily   sacubitril-valsartan  1 tablet Oral BID   spironolactone  25 mg Oral Daily   Continuous  Infusions:   LOS: 2 days   Marguerita Merles, DO [facility] Available via Epic secure chat 7am-7pm After these hours, please refer to coverage provider listed on [email or web address] 04/30/2023, 4:17 PM

## 2023-04-30 NOTE — TOC Initial Note (Addendum)
Transition of Care [facility]) - Initial/Assessment Note    Patient Details  Name: Matthew Warren MRN: [id number] Date of Birth: 01/29/1976  Transition of Care [facility]) CM/SW Contact:    Darleene Cleaver, LCSW Phone Number: 04/30/2023, 6:08 PM  Clinical Narrative:                  Patient is a 47 year old male who is alert and oriented x4.  Patient has a house and lives with his teenager.  Patient was pleasant and talkative.  CSW received consult for patient not having insurance, needing a pcp, and will need medication assistance.  CSW reviewed SDOH for food insecurity.  CSW will provide [employer] for free meals in [place], and [employer] for free food pantries in [place].  CSW recommended that patient review resources for food.  Per patient he has a teenage son, and tries to shop as inexpensive as he can, but understands the inexpensive foods are full of sodium which is not helpful for his situation.  CSW recommended he look at some of the less expensive grocery stores like [employer] and [employer] to save money on healthier foods.  Patient stated that he was taking medication for his blood pressure, but once he ran out, he never refilled the medications and has not been taking any which he stated is the reason he was admitted to the hospital.  CSW discussed with him medication assistance that can possibly be provided depending on what medications he will be discharging on.  CSW will follow up with him once discharge medications are determined.  Patient stated that he has met with financial counseling already, and they he has started the [facility] application process.  Patient is aware that it can take 30-45 days for Medicaid determination to be made.  CSW informed patient that there are a few clinics that can accept patient without insurance in the meantime while he is waiting for Medicaid to be approved.  Patient is agreeable to having CSW arrange to have a PCP appointment set up with one of the  clinics.  CSW to contact the clinics tomorrow to find out how soon patient can get an appointment.  Patient was appreciative of resources being provided for him.     Expected Discharge Plan: Home/Self Care Barriers to Discharge: Continued Medical Work up   Patient Goals and CMS Choice Patient states their goals for this hospitalization and ongoing recovery are:: To return back home with his teenager.          Expected Discharge Plan and Services In-house Referral: Clinical Social Work, Conservator, museum/gallery Services: Medication Assistance, Indigent Health Clinic, HF Clinic, [facility]   Living arrangements for the past 2 months: Single Family Home                                      Prior Living Arrangements/Services Living arrangements for the past 2 months: Single Family Home Lives with:: Minor Children Patient language and need for interpreter reviewed:: Yes Do you feel safe going back to the place where you live?: Yes      Need for Family Participation in Patient Care: No (Comment) Care giver support system in place?: No (comment)   Criminal Activity/Legal Involvement Pertinent to Current Situation/Hospitalization: No - Comment as needed  Activities of Daily Living Home Assistive Devices/Equipment: None ADL Screening (condition at time of admission) Patient's  cognitive ability adequate to safely complete daily activities?: Yes Is the patient deaf or have difficulty hearing?: No Does the patient have difficulty seeing, even when wearing glasses/contacts?: No Does the patient have difficulty concentrating, remembering, or making decisions?: No Patient able to express need for assistance with ADLs?: Yes Does the patient have difficulty dressing or bathing?: No Independently performs ADLs?: Yes (appropriate for developmental age) Does the patient have difficulty walking or climbing stairs?: No Weakness of Legs: None Weakness of Arms/Hands:  None  Permission Sought/Granted Permission sought to share information with : PCP, Case Manager Permission granted to share information with : Yes, Verbal Permission Granted, Yes, Release of Information Signed              Emotional Assessment Appearance:: Appears stated age Attitude/Demeanor/Rapport: Engaged Affect (typically observed): Accepting, Appropriate, Calm, Hopeful, Pleasant, Stable Orientation: : Oriented to Self, Oriented to  Time, Oriented to Situation, Oriented to Place Alcohol / Substance Use: Tobacco Use Psych Involvement: No (comment)  Admission diagnosis:  Acute CHF (congestive heart failure) (HCC) [I50.9] Acute congestive heart failure, unspecified heart failure type (HCC) [I50.9] Patient Active Problem List   Diagnosis Date Noted   Hypertensive urgency 04/29/2023   Uncontrolled hypertension 04/29/2023   AKI (acute kidney injury) (HCC) 04/29/2023   Hypokalemia 04/29/2023   Bronchitis 04/29/2023   Acute combined systolic and diastolic congestive heart failure (HCC) 04/28/2023   Major depressive disorder, single episode, severe (HCC) 11/04/2020   HERPES LABIALIS 04/27/2011   RESPIRATORY DISORDER, ACUTE 04/27/2011   PCP:  Patient, No Pcp Per Pharmacy:   [id number] - [place], [place] - [address] AT [facility] [address] [place] [address] Phone: [phone] Fax: [phone]     Social Determinants of Health (SDOH) Social History: SDOH Screenings   Food Insecurity: Food Insecurity Present (04/30/2023)  Housing: Low Risk  (04/30/2023)  Transportation Needs: No Transportation Needs (04/29/2023)  Utilities: Not At Risk (04/29/2023)  Alcohol Screen: Low Risk  (04/30/2023)  Depression (PHQ2-9): Medium Risk (10/31/2020)  Financial Resource Strain: Medium Risk (04/30/2023)  Social Connections: Unknown (12/25/2021)   [facility]  Tobacco Use: Medium Risk (04/28/2023)    SDOH Interventions: Food Insecurity Interventions: Other (Comment) (Will add information for food pantries and free meals.) Housing Interventions: Intervention Not Indicated Alcohol Usage Interventions: Intervention Not Indicated (Score <7) Financial Strain Interventions: Inpatient TOC   Readmission Risk Interventions     No data to display

## 2023-04-30 NOTE — Progress Notes (Signed)
Heart Failure Nurse Navigator Progress Note  PCP: Patient, No Pcp Per PCP-Cardiologist: none Admission Diagnosis: Acute congestive heart failure Admitted from: Home  Presentation:  Stark Jock presented with shortness of breath over the last few days, nonproductive cough for about 2 weeks, BP 177/122, HR 95, BNP 1,261, CXR with possible bronchitis, cardiomegaly. CTA negative for PE,   Called and spoke with patient at [facility], educated on the sign and symptoms of heart failure, daily weights, when to call his doctor or go to the ED, diet/ fluid restrictions, taking all medications as prescribed and attending all medical appointments, patient verbalized his understanding. A HF TOC appointment was scheduled for 05/13/2023 @ 3 pm.   ECHO/ LVEF: 30-35%  Clinical Course:  History reviewed. No pertinent past medical history.   Social History   Socioeconomic History   Marital status: Single    Spouse name: Not on file   Number of children: 1   Years of education: Not on file   Highest education level: High school graduate  Occupational History   Occupation: Social worker residential services  Tobacco Use   Smoking status: Former    Passive exposure: Never   Smokeless tobacco: Never  Vaping Use   Vaping status: Never Used  Substance and Sexual Activity   Alcohol use: Yes    Comment: social   Drug use: Never   Sexual activity: Not on file  Other Topics Concern   Not on file  Social History Narrative   Not on file   Social Determinants of Health   Financial Resource Strain: High Risk (04/30/2023)   Overall Financial Resource Strain (CARDIA)    Difficulty of Paying Living Expenses: Hard  Food Insecurity: Food Insecurity Present (04/30/2023)   Hunger Vital Sign    Worried About Running Out of Food in the Last Year: Sometimes true    Ran Out of Food in the Last Year: Sometimes true  Transportation Needs: No Transportation Needs (04/29/2023)   PRAPARE - Scientist, research (physical sciences) (Medical): No    Lack of Transportation (Non-Medical): No  Physical Activity: Not on file  Stress: Not on file  Social Connections: Unknown (12/25/2021)   Received from [facility]   Social Network    Social Network: Not on file   Education Assessment and Provision:  Detailed education and instructions provided on heart failure disease management including the following:  Signs and symptoms of Heart Failure When to call the physician Importance of daily weights Low sodium diet Fluid restriction Medication management Anticipated future follow-up appointments  Patient education given on each of the above topics.  Patient acknowledges understanding via teach back method and acceptance of all instructions.  Education Materials:  "Living Better With Heart Failure" Booklet, HF zone tool, & Daily Weight Tracker Tool.  Patient has scale at home: Yes Patient has pill box at home: NA    High Risk Criteria for Readmission and/or Poor Patient Outcomes: Heart failure hospital admissions (last 6 months): 1  No Show rate: 50% Difficult social situation: yes, lives with his teenage son. Food concerns.  Demonstrates medication adherence: No Primary Language: English Literacy level: reading, writing, and comprehension  Barriers of Care:   No Insurance ( medication costs)  Diet/ fluid restrictions/ daily weights Medication compliance  Considerations/Referrals:   Referral made to Heart Failure Pharmacist Stewardship: o from [facility] Referral made to Heart Failure CSW/NCM TOC: yes, no insurance Referral made to Heart & Vascular TOC clinic: Yes, 05/13/2023 @  3 pm.   Items for Follow-up on DC/TOC: No Insurance/ medication compliance Diet/ fluid restrictions Daily weights Continued HF education   Rhae Hammock, BSN, RN Heart Failure Print production planner Chat Only

## 2023-05-01 ENCOUNTER — Other Ambulatory Visit (HOSPITAL_COMMUNITY): Payer: Self-pay

## 2023-05-01 LAB — COMPREHENSIVE METABOLIC PANEL
ALT: 51 U/L — ABNORMAL HIGH (ref 0–44)
AST: 27 U/L (ref 15–41)
Albumin: 3.7 g/dL (ref 3.5–5.0)
Alkaline Phosphatase: 128 U/L — ABNORMAL HIGH (ref 38–126)
Anion gap: 13 (ref 5–15)
BUN: 28 mg/dL — ABNORMAL HIGH (ref 6–20)
CO2: 20 mmol/L — ABNORMAL LOW (ref 22–32)
Calcium: 9.4 mg/dL (ref 8.9–10.3)
Chloride: 102 mmol/L (ref 98–111)
Creatinine, Ser: 1.38 mg/dL — ABNORMAL HIGH (ref 0.61–1.24)
GFR, Estimated: >60 mL/min (ref >60)
Glucose, Bld: 120 mg/dL — ABNORMAL HIGH (ref 70–99)
Potassium: 4.4 mmol/L (ref 3.5–5.1)
Sodium: 135 mmol/L (ref 135–145)
Total Bilirubin: 0.7 mg/dL (ref 0.3–1.2)
Total Protein: 7.5 g/dL (ref 6.5–8.1)

## 2023-05-01 LAB — CBC WITH DIFFERENTIAL/PLATELET
Abs Immature Granulocytes: 0.08 10*3/uL — ABNORMAL HIGH (ref 0.00–0.07)
Abs Immature Granulocytes: 0.07 10*3/uL (ref 0.00–0.07)
Basophils Absolute: 0.2 10*3/uL — ABNORMAL HIGH (ref 0.0–0.1)
Basophils Absolute: 0.2 10*3/uL — ABNORMAL HIGH (ref 0.0–0.1)
Basophils Relative: 1 %
Basophils Relative: 1 %
Eosinophils Absolute: 0.4 10*3/uL (ref 0.0–0.5)
Eosinophils Absolute: 0.3 10*3/uL (ref 0.0–0.5)
Eosinophils Relative: 3 %
Eosinophils Relative: 2 %
HCT: 53.4 % — ABNORMAL HIGH (ref 39.0–52.0)
HCT: 53.8 % — ABNORMAL HIGH (ref 39.0–52.0)
Hemoglobin: 17.4 g/dL — ABNORMAL HIGH (ref 13.0–17.0)
Hemoglobin: 17.6 g/dL — ABNORMAL HIGH (ref 13.0–17.0)
Immature Granulocytes: 1 %
Immature Granulocytes: 1 %
Lymphocytes Relative: 27 %
Lymphocytes Relative: 25 %
Lymphs Abs: 3.5 10*3/uL (ref 0.7–4.0)
Lymphs Abs: 3 10*3/uL (ref 0.7–4.0)
MCH: 29.8 pg (ref 26.0–34.0)
MCH: 29.8 pg (ref 26.0–34.0)
MCHC: 32.6 g/dL (ref 30.0–36.0)
MCHC: 32.7 g/dL (ref 30.0–36.0)
MCV: 91.4 fL (ref 80.0–100.0)
MCV: 91.2 fL (ref 80.0–100.0)
Monocytes Absolute: 1 10*3/uL (ref 0.1–1.0)
Monocytes Absolute: 0.6 10*3/uL (ref 0.1–1.0)
Monocytes Relative: 8 %
Monocytes Relative: 5 %
Neutro Abs: 7.8 10*3/uL — ABNORMAL HIGH (ref 1.7–7.7)
Neutro Abs: 8 10*3/uL — ABNORMAL HIGH (ref 1.7–7.7)
Neutrophils Relative %: 60 %
Neutrophils Relative %: 66 %
Platelets: 403 10*3/uL — ABNORMAL HIGH (ref 150–400)
Platelets: 463 10*3/uL — ABNORMAL HIGH (ref 150–400)
RBC: 5.84 MIL/uL — ABNORMAL HIGH (ref 4.22–5.81)
RBC: 5.9 MIL/uL — ABNORMAL HIGH (ref 4.22–5.81)
RDW: 15.3 % (ref 11.5–15.5)
RDW: 14.5 % (ref 11.5–15.5)
WBC: 13 10*3/uL — ABNORMAL HIGH (ref 4.0–10.5)
WBC: 12.1 10*3/uL — ABNORMAL HIGH (ref 4.0–10.5)
nRBC: 0 % (ref 0.0–0.2)
nRBC: 0 % (ref 0.0–0.2)

## 2023-05-01 LAB — MAGNESIUM: Magnesium: 2.8 mg/dL — ABNORMAL HIGH (ref 1.7–2.4)

## 2023-05-01 LAB — HEMOGLOBIN A1C
Hgb A1c MFr Bld: 6.3 % — ABNORMAL HIGH (ref 4.8–5.6)
Mean Plasma Glucose: 134 mg/dL

## 2023-05-01 LAB — PHOSPHORUS: Phosphorus: 4.1 mg/dL (ref 2.5–4.6)

## 2023-05-01 MED ORDER — ONDANSETRON HCL 4 MG PO TABS
4.0000 mg | ORAL_TABLET | Freq: Four times a day (QID) | ORAL | 0 refills | Status: DC | PRN
  Filled 2023-05-01: qty 20, 5d supply, fill #0

## 2023-05-01 MED ORDER — SACUBITRIL-VALSARTAN 49-51 MG PO TABS
1.0000 | ORAL_TABLET | Freq: Two times a day (BID) | ORAL | 0 refills | Status: DC
  Filled 2023-05-01 (×2): qty 60, 30d supply, fill #0

## 2023-05-01 MED ORDER — ACETAMINOPHEN 325 MG PO TABS
650.0000 mg | ORAL_TABLET | Freq: Four times a day (QID) | ORAL | 0 refills | Status: AC | PRN
  Filled 2023-05-01: qty 20, 3d supply, fill #0

## 2023-05-01 MED ORDER — FUROSEMIDE 40 MG PO TABS
40.0000 mg | ORAL_TABLET | Freq: Every day | ORAL | 0 refills | Status: DC
  Filled 2023-05-01: qty 30, 30d supply, fill #0

## 2023-05-01 MED ORDER — SPIRONOLACTONE 25 MG PO TABS
25.0000 mg | ORAL_TABLET | Freq: Every day | ORAL | 0 refills | Status: DC
  Filled 2023-05-01: qty 30, 30d supply, fill #0

## 2023-05-01 MED ORDER — LORATADINE 10 MG PO TABS
10.0000 mg | ORAL_TABLET | Freq: Every day | ORAL | 0 refills | Status: AC
  Filled 2023-05-01: qty 30, 30d supply, fill #0

## 2023-05-01 MED ORDER — AMLODIPINE BESYLATE 10 MG PO TABS
10.0000 mg | ORAL_TABLET | Freq: Every day | ORAL | 0 refills | Status: DC
  Filled 2023-05-01: qty 30, 30d supply, fill #0

## 2023-05-01 MED ORDER — EMPAGLIFLOZIN 10 MG PO TABS
10.0000 mg | ORAL_TABLET | Freq: Every day | ORAL | 0 refills | Status: DC
  Filled 2023-05-01 (×3): qty 30, 30d supply, fill #0

## 2023-05-01 MED ORDER — SALINE SPRAY 0.65 % NA SOLN
1.0000 | NASAL | 0 refills | Status: AC | PRN
  Filled 2023-05-01: qty 44, 7d supply, fill #0

## 2023-05-01 MED ORDER — ROSUVASTATIN CALCIUM 20 MG PO TABS
20.0000 mg | ORAL_TABLET | Freq: Every day | ORAL | 0 refills | Status: DC
  Filled 2023-05-01: qty 30, 30d supply, fill #0

## 2023-05-01 MED ORDER — CARVEDILOL 6.25 MG PO TABS
6.2500 mg | ORAL_TABLET | Freq: Two times a day (BID) | ORAL | 0 refills | Status: DC
  Filled 2023-05-01: qty 60, 30d supply, fill #0

## 2023-05-01 NOTE — Plan of Care (Signed)

## 2023-05-01 NOTE — Progress Notes (Signed)
MATCH MEDICATION ASSISTANCE CARD Pharmacies please call [phone] for claim processing assistance.  Rx BIN: [id number] Rx Group: [id number] Rx PCN: [id number] Relationship Code: 1 Person Code: [id number]  Patient ID (MRN): [id number]      Patient Name:  Matthew Warren, Matthew Warren   Patient DOB: [birth year]   Discharge Date: 05/01/2023  Expiration Date: 05/08/2023 (must be filled within 7 days of discharge)   Dear Stark Jock,  You have been approved to have the prescriptions written by your discharging physician filled through our [facility] (Medication Assistance Through [facility]) program. This program allows for a one-time (no refills) 34-day supply of selected medications for a low copay amount.  The copay is $3.00 per prescription. For instance, if you have one prescription, you will pay $3.00; for two prescriptions, you pay $6.00; for three prescriptions, you pay $9.00; and so on. Only certain pharmacies are participating in this program with [facility]. You will need to select one of the pharmacies from the attached lists and take your prescriptions, this letter, and your photo ID to one of the participating pharmacies.  We are excited that you are able to use the [facility] program to get your medications. These prescriptions must be filled within 7 days of hospital discharge or they will no longer be valid for the [facility] program. Should you have any problems with your prescriptions please contact your case management team member at [phone] for [place] or [phone] for [facility].  Thank you, [facility]    [facility] Pharmacies [place]. [facility], [facility], [facility]  [facility] [address] [place], [place] [address] [place], [place] [address], [place], [employer] [address] [place], [place] [address] [place], [place] 638 Transitions of Care - [facility]  [address], [place] 1238 [place], [place], [place] [facility] [address] [place] [address], [facility] [address], [place]  [address] [place] [address] [place], [place] [address] [place]ree Ditto [address] [employer]  [address] [place], [place]  [address] [place] [address] [place], [place]  [address] [place] [address] [place]  [address] [place], [place]  605 [place], [place]  1040 [place] [place], [place] [address], [place]  3341 [place], [place] 1628 [place], [place] [address], [facility] [address], [place] 124 [place], [place] 4700 [place], [place] [address] [place] 1398 [place], [place] CVS (continued) [address] [place] 717 [address], [place] [address], [place] [address], [place] [address], [place] [address] [place], [place] [address] 6310 [place], [place] [address]55 [address] [place] [address] [place], [address] [facility] [address] [place] [address], [place] [address] [place] [address], [place] [address] [place], [place] 317 [place] 3501 [place],  [address] [place], [place] 2416 [place], [place] [address] [place], [place]  [address], [place] 3529 [place], [place]  3703 [place], [place] [address], [place] [address] [facility] [address], [place] [address] [place] 2019 [place], [address] [place], [place]  [facility] (continued) [address], [facility] [address] [place] [address], [place] 5005 [place], [place] [address],  [place] [address] [place] [address] [place] 4568 [place], [place] 1015 [place], [place] Other [facility] [address] [place], [facility] [address] [facility] [address], [place]    -1:32pm Call to Jiles Crocker, [facility] Supervisor, approved override of Jardiance 10mg  po , [facility] outpatient pharmacy notified.

## 2023-05-01 NOTE — Progress Notes (Signed)
AVS reviewed w/ Denyse Amass who verbalized an understanding - no other questions at this time - pt d/c home w/ a friend. Central tele  called at 1617 to d/c tele. PIV removed as documented .

## 2023-05-01 NOTE — Plan of Care (Signed)

## 2023-05-01 NOTE — TOC Transition Note (Signed)
Transition of Care [facility]) - CM/SW Discharge Note   Patient Details  Name: Matthew Warren MRN: [id number] Date of Birth: 1976-06-07  Transition of Care [facility]) CM/SW Contact:  Darleene Cleaver, LCSW Phone Number: 05/01/2023, 4:11 PM   Clinical Narrative:     Patient has been approved to use Match to get his medications.  CSW, RN case Production designer, theatre/television/film, TOC supervisor, and [facility] coordinated to have patient's meds delivered to the bed side.  CSW was able to get an appointment set up for patient to get established with a PCP for next week.  Patient was provided information where he can get free meals and use the food pantry.  Patient stated his family will be in town this weekend and can assist him.  Patient stated he plans to return back home, and did not have any other questions.  Final next level of care: Home/Self Care Barriers to Discharge: Continued Medical Work up   Patient Goals and CMS Choice      Discharge Placement    Plan to return back home.                     Discharge Plan and Services Additional resources added to the After Visit Summary for   In-house Referral: Clinical Social Work, Artist Discharge Planning Services: Medication Assistance, Indigent Health Clinic, HF Clinic, [facility]                                 Social Determinants of Health (SDOH) Interventions SDOH Screenings   Food Insecurity: Food Insecurity Present (04/30/2023)  Housing: Low Risk  (04/30/2023)  Transportation Needs: No Transportation Needs (04/29/2023)  Utilities: Not At Risk (04/29/2023)  Alcohol Screen: Low Risk  (04/30/2023)  Depression (PHQ2-9): Medium Risk (10/31/2020)  Financial Resource Strain: Medium Risk (04/30/2023)  Social Connections: Unknown (12/25/2021)   [facility]  Tobacco Use: Medium Risk (04/28/2023)     Readmission Risk Interventions     No data to display

## 2023-05-01 NOTE — Progress Notes (Signed)
Patient Name: Matthew Warren Date of Encounter: 05/01/2023 [facility] Cardiologist: Chrystie Nose, MD   Interval Summary  .    Feel significantly better today without a cough or any shortness of breath.  No complaints and ready to go home   Vital Signs .    Vitals:   04/30/23 1739 04/30/23 2030 05/01/23 0551 05/01/23 0854  BP: 130/89 (!) 130/99 (!) 141/96 123/88  Pulse: 95 95 88 (!) 106  Resp:  (!) 22 20   Temp:  98 F (36.7 C) 97.8 F (36.6 C)   TempSrc:  Oral Oral   SpO2:  97% 97%   Weight:      Height:        Intake/Output Summary (Last 24 hours) at 05/01/2023 1131 Last data filed at 04/30/2023 1855 Gross per 24 hour  Intake 120 ml  Output 1450 ml  Net -1330 ml      04/28/2023    8:48 AM 05/31/2022    5:45 PM 04/27/2011   12:17 PM  Last 3 Weights  Weight (lbs) 175 lb 180 lb 175 lb  Weight (kg) 79.379 kg 81.647 kg 79.379 kg      Telemetry/ECG    Normal sinus rhythm heart rate between 90-100- Personally Reviewed  CV Studies    Echocardiogram 04/29/2023 1. Left ventricular ejection fraction, by estimation, is 30 to 35%. The  left ventricle has moderately decreased function. The left ventricle  demonstrates global hypokinesis. The left ventricular internal cavity size  was moderately dilated. Left  ventricular diastolic parameters are consistent with Grade III diastolic  dysfunction (restrictive). Elevated left atrial pressure.   2. Right ventricular systolic function is normal. The right ventricular  size is normal. Tricuspid regurgitation signal is inadequate for assessing  PA pressure.   3. Left atrial size was mildly dilated.   4. Right atrial size was mildly dilated.   5. The mitral valve is normal in structure. Trivial mitral valve  regurgitation.   6. The aortic valve is tricuspid. Aortic valve regurgitation is not  visualized. No aortic stenosis is present.   7. The inferior vena cava is dilated in size with <50% respiratory   variability, suggesting right atrial pressure of 15 mmHg.   Physical Exam .   GEN: No acute distress.   Neck: no JVD Cardiac: RRR, no murmurs, rubs, or gallops.  Respiratory: Clear to auscultation bilaterally. GI: Soft, nontender, non-distended  MS: mild ankle edema  Patient Profile    Matthew Warren is a 47 y.o. male has hx of hypertension (recently noncompliant)  and admitted on 04/29/2023 for the evaluation of new onset HFrEF.   Assessment & Plan .     New onset acute HFrEF Had elevated BNP 1200+, pulmonary edema, shortness of breath now improved after IV Lasix 40 mg daily.   Echocardiogram shows EF 30 to 35% with grade 3 diastolic dysfunction.  Normal RV function.  PTA no real complaints until SOB recently. Unclear of etiology of his heart failure given that he does have family history of heart disease, previous drug use, uncontrolled hypertension, recent episode of chest pain.  Will plan for outpatient coronary CTA that can be done outpatient to try to assess for potential ischemic causes. May consider catherization at some point if EF does not improve with GDMT and better BP control.   Appears to be sufficiently diuresed as indicated on physical exam as well as lab findings.  Creatinine and BUN have increased likely suggesting sufficient diuresis.  We have transitioned him to oral diuretics.  He had an elevated white count today however if no further workup and normal likely can discharge. Continue carvedilol 6.25 mg twice daily, Jardiance 10mg  and Entresto 49-51mg  BID spironolactone 25 mg today. Continue p.o. Lasix 40 mg   Hypokalemia Has normalized today  Hypertension On admission poorly controlled with systolics in the 180s.  PTA was on amlodipine and was noncompliant with this.  With titration of GDMT as above significantly improved blood pressures in the 130s systolic.   CKD Borderline.  Slightly increased today at 1.38.  Likely related to diuresis may be underlying renal  dysfunction due to uncontrolled hypertension.   Suspected OSA  Needs sleep study outpatient.   Hyperlipidemia This admission cholesterol 298, HDL 53, LDL 180, glycerides 323.  Reports eating a lot of gas station food.  Will start him on rosuvastatin 20 mg with plans to uptitrate if he tolerates.  + Cocaine Previously had reported use between age of 55 and 9.  Previously denied use however now potentially recalls may be partaking in activities this past weekend in the setting of drinking.  Reiterated importance of avoiding these types of drugs.  We discussed in much detail the major changes he should undertake moving forward.  Patient voices good understanding and agreement and will get the help that he needs.  Will arrange follow-up.  For questions or updates, please contact [facility] Please consult [email or web address] for contact info under        Signed, Abagail Kitchens, PA-C

## 2023-05-01 NOTE — TOC Progression Note (Signed)
Transition of Care [facility]) - Progression Note    Patient Details  Name: Matthew Warren MRN: [id number] Date of Birth: 1975/09/18  Transition of Care [facility]) CM/SW Contact  Darleene Cleaver, [place] Phone Number: 05/01/2023, 11:49 AM  Clinical Narrative:     Patient does not have a PCP, CSW was able to arrange for patient to have an appointment on September 25th at 2:20pm at [facility], [address] [place], [place] [address], [phone].  Information has been added to patient's AVS.  Patient, will need some assistance with medications prior to discharge.  TOC to continue to follow patient's progress throughout discharge planning and to provide assistance with medications.    Expected Discharge Plan: Home/Self Care Barriers to Discharge: Continued Medical Work up  Expected Discharge Plan and Services In-house Referral: Clinical Social Work, Conservator, museum/gallery Services: Medication Assistance, Indigent Health Clinic, HF Clinic, [facility]   Living arrangements for the past 2 months: Single Family Home                                       Social Determinants of Health (SDOH) Interventions SDOH Screenings   Food Insecurity: Food Insecurity Present (04/30/2023)  Housing: Low Risk  (04/30/2023)  Transportation Needs: No Transportation Needs (04/29/2023)  Utilities: Not At Risk (04/29/2023)  Alcohol Screen: Low Risk  (04/30/2023)  Depression (PHQ2-9): Medium Risk (10/31/2020)  Financial Resource Strain: Medium Risk (04/30/2023)  Social Connections: Unknown (12/25/2021)   [facility]  Tobacco Use: Medium Risk (04/28/2023)    Readmission Risk Interventions     No data to display

## 2023-05-02 ENCOUNTER — Telehealth: Payer: Self-pay | Admitting: Cardiology

## 2023-05-02 NOTE — Telephone Encounter (Signed)
Transition of Care Follow-up Phone Call Request    Patient Name: Matthew Warren Date of Birth: 05/10/1976 Date of Encounter: 05/02/2023  Primary Care Provider:  Patient, No Pcp Per Primary Cardiologist:  Chrystie Nose, MD  Stark Jock has been scheduled for a transition of care follow up appointment with a [facility] provider:  [facility]. 05/13/2023 1st appointment. Please have patient repeat BMP in one week.  Please reach out to [facility] within 48 hours of discharge to confirm appointment and review transition of care protocol questionnaire. Anticipated discharge date: Bernardo Heater, PA-C  05/02/2023, 7:27 AM

## 2023-05-02 NOTE — Telephone Encounter (Signed)
Left voicemail to return call to office.

## 2023-05-03 NOTE — Discharge Summary (Signed)
judgment and insight. Alert and oriented x 3. Normal mood and appropriate affect.   Condition at discharge: stable  The results of significant diagnostics from this hospitalization (including imaging, microbiology, ancillary and laboratory) are listed below for reference.   Imaging Studies: DG CHEST PORT 1 VIEW  Result Date: 04/30/2023 CLINICAL DATA:  Shortness of breath EXAM: PORTABLE CHEST 1 VIEW COMPARISON:  04/28/2023 FINDINGS: Cardiac shadow is stable. Lungs are well aerated bilaterally. Previously seen peribronchial thickening has resolved. No bony abnormality is noted. IMPRESSION: No active disease. Electronically Signed   By: Matthew Warren M.D.   On: 04/30/2023 20:05   ECHOCARDIOGRAM COMPLETE  Result Date: 04/29/2023    ECHOCARDIOGRAM REPORT   Patient Name:   Matthew Warren Date of Exam: 04/29/2023 Medical Rec #:  [id number]        Height:       70.0 in Accession #:    [id number]       Weight:       175.0 lb Date of Birth:  [birth year]        BSA:          1.972 m Patient Age:    47 years         BP:           172/110 mmHg Patient Gender: M                HR:           95 bpm. Exam Location:  Inpatient Procedure: 2D Echo, Cardiac  Doppler, Color Doppler and Intracardiac            Opacification Agent Indications:    CHF  History:        Patient has no prior history of Echocardiogram examinations.                 Risk Factors:Hypertension.  Sonographer:    Darlys Gales Referring Phys: [id number] MIR M [facility] IMPRESSIONS  1. Left ventricular ejection fraction, by estimation, is 30 to 35%. The left ventricle has moderately decreased function. The left ventricle demonstrates global hypokinesis. The left ventricular internal cavity size was moderately dilated. Left ventricular diastolic parameters are consistent with Grade III diastolic dysfunction (restrictive). Elevated left atrial pressure.  2. Right ventricular systolic function is normal. The right ventricular size is normal. Tricuspid regurgitation signal is inadequate for assessing PA pressure.  3. Left atrial size was mildly dilated.  4. Right atrial size was mildly dilated.  5. The mitral valve is normal in structure. Trivial mitral valve regurgitation.  6. The aortic valve is tricuspid. Aortic valve regurgitation is not visualized. No aortic stenosis is present.  7. The inferior vena cava is dilated in size with <50% respiratory variability, suggesting right atrial pressure of 15 mmHg. FINDINGS  Left Ventricle: Left ventricular ejection fraction, by estimation, is 30 to 35%. The left ventricle has moderately decreased function. The left ventricle demonstrates global hypokinesis. Definity contrast agent was given IV to delineate the left ventricular endocardial borders. The left ventricular internal cavity size was moderately dilated. There is no left ventricular hypertrophy. Left ventricular diastolic parameters are consistent with Grade III diastolic dysfunction (restrictive). Elevated  left atrial pressure. Right Ventricle: The right ventricular size is normal. Right vetricular wall thickness was not well visualized. Right ventricular systolic function is normal. Tricuspid  regurgitation signal is inadequate for assessing PA pressure. Left Atrium: Left atrial size was mildly dilated. Right Atrium: Right atrial size was mildly dilated. Pericardium: Trivial pericardial  Physician Discharge Summary   Patient: Matthew Warren MRN: [id number] DOB: 03/02/1976  Admit date:     04/28/2023  Discharge date: 05/01/2023  Discharge Physician: Marguerita Merles, DO   PCP: Patient, No Pcp Per   Recommendations at discharge:   Follow-up with PCP within 1 to 2 weeks and repeat CBC, CMP, mag, Phos within 1 week Follow-up with cardiology outpatient setting and they are can arrange follow-up appointment for the patient and they are going to follow-up for an outpatient coronary CT Repeat chest x-ray in 3 to 6 weeks Patient needs an outpatient sleep study  Discharge Diagnoses: Principal Problem:   Acute combined systolic and diastolic congestive heart failure (HCC) Active Problems:   Hypertensive urgency   Uncontrolled hypertension   AKI (acute kidney injury) (HCC)   Hypokalemia   Bronchitis  Resolved Problems:   * No resolved hospital problems. [facility] Psychiatric Course: [name] is a 47 yo male with PMH HTN, med noncompliance due to financial constraints who is admitted with acute SOB.  He has been off of amlodipine for approximately 4 months due to inability to afford and also does not have a PCP anymore.  He had also described some mild chest pain with exertion prior to admission. He has also had a suspected upper respiratory infection for approximately a couple weeks and has been using over-the-counter cough syrups notably containing pseudoephedrine.  On workup in the ER he was found to be significantly hypertensive, 188/142. Notable labs included: BNP 1,261. Elevated dimer 2.25. Creat 1.33 (no recent labs for comparison). CXR showed underlying bronchitis and/or pulmonary edema.  CT angio chest negative for PE and shows pulmonary edema including cardiomegaly and bilateral small pleural effusions.  Also scattered groundglass opacities and enlarged mediastinal and hilar lymph nodes considered reactive.  He was admitted for blood pressure control, obtaining echo, and  diuresis.  Cardiology also consulted after abnormal echo findings.  Interim History  He is currently continue to get diuresed and cardiology is planning on transitioning from IV diuretics to oral diuretics 05/01/2023.  He was transitioned to oral diuretics and cardiology signed off the case.  The [facility] team and the pharmacy were able to get him his medications.  He will need to follow-up with PCP and cardiology outpatient setting and have outpatient coronary CTA done.  Assessment and Plan:  * Acute combined systolic and diastolic congestive heart failure (HCC) -Presents with DOE/SOB. Found to have pleural effusions, trace LE edema, pulmonary edema -BNP 1,261 -Image studies consistent with volume overload/CHF -Echo: EF 30-35%, grade 3 DD. LV global hypokinesis, dilated LV. Dilated RA and LA. Dilated IVC -Strict I's and O's and daily weights; No intake or output data in the 24 hours ending 05/03/23 1736  Filed Weights   04/28/23 0848  Weight: 79.4 kg  -Cardiology consulted given new CHF; etiology possibly hypertensive heart disease vs ruling out ischemia (outpatient workup planned) -GDMT being started inpatient; he has no insurance nor PCP and will need to make sure he can afford meds prior to d/c and has PCP set up -See below for medications -Patient was getting IV diuresis and is now being changed to p.o. tomorrow -Continue with Empagliflozin 10 mg p.o. daily and cardiology is adding spironolactone 25 mg p.o. daily today -Cardiology recommending outpatient coronary CTA to exclude CAD -Given his improvement he is deemed stable for discharge and cardiology recommended the medications and we have asked the [facility] and pharmacy to get him his medications   Hypertensive urgency -Initial BP  Physician Discharge Summary   Patient: Matthew Warren MRN: [id number] DOB: 03/02/1976  Admit date:     04/28/2023  Discharge date: 05/01/2023  Discharge Physician: Marguerita Merles, DO   PCP: Patient, No Pcp Per   Recommendations at discharge:   Follow-up with PCP within 1 to 2 weeks and repeat CBC, CMP, mag, Phos within 1 week Follow-up with cardiology outpatient setting and they are can arrange follow-up appointment for the patient and they are going to follow-up for an outpatient coronary CT Repeat chest x-ray in 3 to 6 weeks Patient needs an outpatient sleep study  Discharge Diagnoses: Principal Problem:   Acute combined systolic and diastolic congestive heart failure (HCC) Active Problems:   Hypertensive urgency   Uncontrolled hypertension   AKI (acute kidney injury) (HCC)   Hypokalemia   Bronchitis  Resolved Problems:   * No resolved hospital problems. [facility] Psychiatric Course: [name] is a 47 yo male with PMH HTN, med noncompliance due to financial constraints who is admitted with acute SOB.  He has been off of amlodipine for approximately 4 months due to inability to afford and also does not have a PCP anymore.  He had also described some mild chest pain with exertion prior to admission. He has also had a suspected upper respiratory infection for approximately a couple weeks and has been using over-the-counter cough syrups notably containing pseudoephedrine.  On workup in the ER he was found to be significantly hypertensive, 188/142. Notable labs included: BNP 1,261. Elevated dimer 2.25. Creat 1.33 (no recent labs for comparison). CXR showed underlying bronchitis and/or pulmonary edema.  CT angio chest negative for PE and shows pulmonary edema including cardiomegaly and bilateral small pleural effusions.  Also scattered groundglass opacities and enlarged mediastinal and hilar lymph nodes considered reactive.  He was admitted for blood pressure control, obtaining echo, and  diuresis.  Cardiology also consulted after abnormal echo findings.  Interim History  He is currently continue to get diuresed and cardiology is planning on transitioning from IV diuretics to oral diuretics 05/01/2023.  He was transitioned to oral diuretics and cardiology signed off the case.  The [facility] team and the pharmacy were able to get him his medications.  He will need to follow-up with PCP and cardiology outpatient setting and have outpatient coronary CTA done.  Assessment and Plan:  * Acute combined systolic and diastolic congestive heart failure (HCC) -Presents with DOE/SOB. Found to have pleural effusions, trace LE edema, pulmonary edema -BNP 1,261 -Image studies consistent with volume overload/CHF -Echo: EF 30-35%, grade 3 DD. LV global hypokinesis, dilated LV. Dilated RA and LA. Dilated IVC -Strict I's and O's and daily weights; No intake or output data in the 24 hours ending 05/03/23 1736  Filed Weights   04/28/23 0848  Weight: 79.4 kg  -Cardiology consulted given new CHF; etiology possibly hypertensive heart disease vs ruling out ischemia (outpatient workup planned) -GDMT being started inpatient; he has no insurance nor PCP and will need to make sure he can afford meds prior to d/c and has PCP set up -See below for medications -Patient was getting IV diuresis and is now being changed to p.o. tomorrow -Continue with Empagliflozin 10 mg p.o. daily and cardiology is adding spironolactone 25 mg p.o. daily today -Cardiology recommending outpatient coronary CTA to exclude CAD -Given his improvement he is deemed stable for discharge and cardiology recommended the medications and we have asked the [facility] and pharmacy to get him his medications   Hypertensive urgency -Initial BP  judgment and insight. Alert and oriented x 3. Normal mood and appropriate affect.   Condition at discharge: stable  The results of significant diagnostics from this hospitalization (including imaging, microbiology, ancillary and laboratory) are listed below for reference.   Imaging Studies: DG CHEST PORT 1 VIEW  Result Date: 04/30/2023 CLINICAL DATA:  Shortness of breath EXAM: PORTABLE CHEST 1 VIEW COMPARISON:  04/28/2023 FINDINGS: Cardiac shadow is stable. Lungs are well aerated bilaterally. Previously seen peribronchial thickening has resolved. No bony abnormality is noted. IMPRESSION: No active disease. Electronically Signed   By: Matthew Warren M.D.   On: 04/30/2023 20:05   ECHOCARDIOGRAM COMPLETE  Result Date: 04/29/2023    ECHOCARDIOGRAM REPORT   Patient Name:   Matthew Warren Date of Exam: 04/29/2023 Medical Rec #:  [id number]        Height:       70.0 in Accession #:    [id number]       Weight:       175.0 lb Date of Birth:  [birth year]        BSA:          1.972 m Patient Age:    47 years         BP:           172/110 mmHg Patient Gender: M                HR:           95 bpm. Exam Location:  Inpatient Procedure: 2D Echo, Cardiac  Doppler, Color Doppler and Intracardiac            Opacification Agent Indications:    CHF  History:        Patient has no prior history of Echocardiogram examinations.                 Risk Factors:Hypertension.  Sonographer:    Darlys Gales Referring Phys: [id number] MIR M [facility] IMPRESSIONS  1. Left ventricular ejection fraction, by estimation, is 30 to 35%. The left ventricle has moderately decreased function. The left ventricle demonstrates global hypokinesis. The left ventricular internal cavity size was moderately dilated. Left ventricular diastolic parameters are consistent with Grade III diastolic dysfunction (restrictive). Elevated left atrial pressure.  2. Right ventricular systolic function is normal. The right ventricular size is normal. Tricuspid regurgitation signal is inadequate for assessing PA pressure.  3. Left atrial size was mildly dilated.  4. Right atrial size was mildly dilated.  5. The mitral valve is normal in structure. Trivial mitral valve regurgitation.  6. The aortic valve is tricuspid. Aortic valve regurgitation is not visualized. No aortic stenosis is present.  7. The inferior vena cava is dilated in size with <50% respiratory variability, suggesting right atrial pressure of 15 mmHg. FINDINGS  Left Ventricle: Left ventricular ejection fraction, by estimation, is 30 to 35%. The left ventricle has moderately decreased function. The left ventricle demonstrates global hypokinesis. Definity contrast agent was given IV to delineate the left ventricular endocardial borders. The left ventricular internal cavity size was moderately dilated. There is no left ventricular hypertrophy. Left ventricular diastolic parameters are consistent with Grade III diastolic dysfunction (restrictive). Elevated  left atrial pressure. Right Ventricle: The right ventricular size is normal. Right vetricular wall thickness was not well visualized. Right ventricular systolic function is normal. Tricuspid  regurgitation signal is inadequate for assessing PA pressure. Left Atrium: Left atrial size was mildly dilated. Right Atrium: Right atrial size was mildly dilated. Pericardium: Trivial pericardial  Physician Discharge Summary   Patient: Matthew Warren MRN: [id number] DOB: 03/02/1976  Admit date:     04/28/2023  Discharge date: 05/01/2023  Discharge Physician: Marguerita Merles, DO   PCP: Patient, No Pcp Per   Recommendations at discharge:   Follow-up with PCP within 1 to 2 weeks and repeat CBC, CMP, mag, Phos within 1 week Follow-up with cardiology outpatient setting and they are can arrange follow-up appointment for the patient and they are going to follow-up for an outpatient coronary CT Repeat chest x-ray in 3 to 6 weeks Patient needs an outpatient sleep study  Discharge Diagnoses: Principal Problem:   Acute combined systolic and diastolic congestive heart failure (HCC) Active Problems:   Hypertensive urgency   Uncontrolled hypertension   AKI (acute kidney injury) (HCC)   Hypokalemia   Bronchitis  Resolved Problems:   * No resolved hospital problems. [facility] Psychiatric Course: [name] is a 47 yo male with PMH HTN, med noncompliance due to financial constraints who is admitted with acute SOB.  He has been off of amlodipine for approximately 4 months due to inability to afford and also does not have a PCP anymore.  He had also described some mild chest pain with exertion prior to admission. He has also had a suspected upper respiratory infection for approximately a couple weeks and has been using over-the-counter cough syrups notably containing pseudoephedrine.  On workup in the ER he was found to be significantly hypertensive, 188/142. Notable labs included: BNP 1,261. Elevated dimer 2.25. Creat 1.33 (no recent labs for comparison). CXR showed underlying bronchitis and/or pulmonary edema.  CT angio chest negative for PE and shows pulmonary edema including cardiomegaly and bilateral small pleural effusions.  Also scattered groundglass opacities and enlarged mediastinal and hilar lymph nodes considered reactive.  He was admitted for blood pressure control, obtaining echo, and  diuresis.  Cardiology also consulted after abnormal echo findings.  Interim History  He is currently continue to get diuresed and cardiology is planning on transitioning from IV diuretics to oral diuretics 05/01/2023.  He was transitioned to oral diuretics and cardiology signed off the case.  The [facility] team and the pharmacy were able to get him his medications.  He will need to follow-up with PCP and cardiology outpatient setting and have outpatient coronary CTA done.  Assessment and Plan:  * Acute combined systolic and diastolic congestive heart failure (HCC) -Presents with DOE/SOB. Found to have pleural effusions, trace LE edema, pulmonary edema -BNP 1,261 -Image studies consistent with volume overload/CHF -Echo: EF 30-35%, grade 3 DD. LV global hypokinesis, dilated LV. Dilated RA and LA. Dilated IVC -Strict I's and O's and daily weights; No intake or output data in the 24 hours ending 05/03/23 1736  Filed Weights   04/28/23 0848  Weight: 79.4 kg  -Cardiology consulted given new CHF; etiology possibly hypertensive heart disease vs ruling out ischemia (outpatient workup planned) -GDMT being started inpatient; he has no insurance nor PCP and will need to make sure he can afford meds prior to d/c and has PCP set up -See below for medications -Patient was getting IV diuresis and is now being changed to p.o. tomorrow -Continue with Empagliflozin 10 mg p.o. daily and cardiology is adding spironolactone 25 mg p.o. daily today -Cardiology recommending outpatient coronary CTA to exclude CAD -Given his improvement he is deemed stable for discharge and cardiology recommended the medications and we have asked the [facility] and pharmacy to get him his medications   Hypertensive urgency -Initial BP  judgment and insight. Alert and oriented x 3. Normal mood and appropriate affect.   Condition at discharge: stable  The results of significant diagnostics from this hospitalization (including imaging, microbiology, ancillary and laboratory) are listed below for reference.   Imaging Studies: DG CHEST PORT 1 VIEW  Result Date: 04/30/2023 CLINICAL DATA:  Shortness of breath EXAM: PORTABLE CHEST 1 VIEW COMPARISON:  04/28/2023 FINDINGS: Cardiac shadow is stable. Lungs are well aerated bilaterally. Previously seen peribronchial thickening has resolved. No bony abnormality is noted. IMPRESSION: No active disease. Electronically Signed   By: Matthew Warren M.D.   On: 04/30/2023 20:05   ECHOCARDIOGRAM COMPLETE  Result Date: 04/29/2023    ECHOCARDIOGRAM REPORT   Patient Name:   Matthew Warren Date of Exam: 04/29/2023 Medical Rec #:  [id number]        Height:       70.0 in Accession #:    [id number]       Weight:       175.0 lb Date of Birth:  [birth year]        BSA:          1.972 m Patient Age:    47 years         BP:           172/110 mmHg Patient Gender: M                HR:           95 bpm. Exam Location:  Inpatient Procedure: 2D Echo, Cardiac  Doppler, Color Doppler and Intracardiac            Opacification Agent Indications:    CHF  History:        Patient has no prior history of Echocardiogram examinations.                 Risk Factors:Hypertension.  Sonographer:    Darlys Gales Referring Phys: [id number] MIR M [facility] IMPRESSIONS  1. Left ventricular ejection fraction, by estimation, is 30 to 35%. The left ventricle has moderately decreased function. The left ventricle demonstrates global hypokinesis. The left ventricular internal cavity size was moderately dilated. Left ventricular diastolic parameters are consistent with Grade III diastolic dysfunction (restrictive). Elevated left atrial pressure.  2. Right ventricular systolic function is normal. The right ventricular size is normal. Tricuspid regurgitation signal is inadequate for assessing PA pressure.  3. Left atrial size was mildly dilated.  4. Right atrial size was mildly dilated.  5. The mitral valve is normal in structure. Trivial mitral valve regurgitation.  6. The aortic valve is tricuspid. Aortic valve regurgitation is not visualized. No aortic stenosis is present.  7. The inferior vena cava is dilated in size with <50% respiratory variability, suggesting right atrial pressure of 15 mmHg. FINDINGS  Left Ventricle: Left ventricular ejection fraction, by estimation, is 30 to 35%. The left ventricle has moderately decreased function. The left ventricle demonstrates global hypokinesis. Definity contrast agent was given IV to delineate the left ventricular endocardial borders. The left ventricular internal cavity size was moderately dilated. There is no left ventricular hypertrophy. Left ventricular diastolic parameters are consistent with Grade III diastolic dysfunction (restrictive). Elevated  left atrial pressure. Right Ventricle: The right ventricular size is normal. Right vetricular wall thickness was not well visualized. Right ventricular systolic function is normal. Tricuspid  regurgitation signal is inadequate for assessing PA pressure. Left Atrium: Left atrial size was mildly dilated. Right Atrium: Right atrial size was mildly dilated. Pericardium: Trivial pericardial  Physician Discharge Summary   Patient: Matthew Warren MRN: [id number] DOB: 03/02/1976  Admit date:     04/28/2023  Discharge date: 05/01/2023  Discharge Physician: Marguerita Merles, DO   PCP: Patient, No Pcp Per   Recommendations at discharge:   Follow-up with PCP within 1 to 2 weeks and repeat CBC, CMP, mag, Phos within 1 week Follow-up with cardiology outpatient setting and they are can arrange follow-up appointment for the patient and they are going to follow-up for an outpatient coronary CT Repeat chest x-ray in 3 to 6 weeks Patient needs an outpatient sleep study  Discharge Diagnoses: Principal Problem:   Acute combined systolic and diastolic congestive heart failure (HCC) Active Problems:   Hypertensive urgency   Uncontrolled hypertension   AKI (acute kidney injury) (HCC)   Hypokalemia   Bronchitis  Resolved Problems:   * No resolved hospital problems. [facility] Psychiatric Course: [name] is a 47 yo male with PMH HTN, med noncompliance due to financial constraints who is admitted with acute SOB.  He has been off of amlodipine for approximately 4 months due to inability to afford and also does not have a PCP anymore.  He had also described some mild chest pain with exertion prior to admission. He has also had a suspected upper respiratory infection for approximately a couple weeks and has been using over-the-counter cough syrups notably containing pseudoephedrine.  On workup in the ER he was found to be significantly hypertensive, 188/142. Notable labs included: BNP 1,261. Elevated dimer 2.25. Creat 1.33 (no recent labs for comparison). CXR showed underlying bronchitis and/or pulmonary edema.  CT angio chest negative for PE and shows pulmonary edema including cardiomegaly and bilateral small pleural effusions.  Also scattered groundglass opacities and enlarged mediastinal and hilar lymph nodes considered reactive.  He was admitted for blood pressure control, obtaining echo, and  diuresis.  Cardiology also consulted after abnormal echo findings.  Interim History  He is currently continue to get diuresed and cardiology is planning on transitioning from IV diuretics to oral diuretics 05/01/2023.  He was transitioned to oral diuretics and cardiology signed off the case.  The [facility] team and the pharmacy were able to get him his medications.  He will need to follow-up with PCP and cardiology outpatient setting and have outpatient coronary CTA done.  Assessment and Plan:  * Acute combined systolic and diastolic congestive heart failure (HCC) -Presents with DOE/SOB. Found to have pleural effusions, trace LE edema, pulmonary edema -BNP 1,261 -Image studies consistent with volume overload/CHF -Echo: EF 30-35%, grade 3 DD. LV global hypokinesis, dilated LV. Dilated RA and LA. Dilated IVC -Strict I's and O's and daily weights; No intake or output data in the 24 hours ending 05/03/23 1736  Filed Weights   04/28/23 0848  Weight: 79.4 kg  -Cardiology consulted given new CHF; etiology possibly hypertensive heart disease vs ruling out ischemia (outpatient workup planned) -GDMT being started inpatient; he has no insurance nor PCP and will need to make sure he can afford meds prior to d/c and has PCP set up -See below for medications -Patient was getting IV diuresis and is now being changed to p.o. tomorrow -Continue with Empagliflozin 10 mg p.o. daily and cardiology is adding spironolactone 25 mg p.o. daily today -Cardiology recommending outpatient coronary CTA to exclude CAD -Given his improvement he is deemed stable for discharge and cardiology recommended the medications and we have asked the [facility] and pharmacy to get him his medications   Hypertensive urgency -Initial BP

## 2023-05-05 NOTE — Telephone Encounter (Signed)
Patient contacted regarding discharge from [facility] on May 01, 2023.Marland Kitchen  Patient understands to follow up with provider Edd Fabian on 10/5 at 8:25 am at [facility] . Patient understands discharge instructions? YES Patient understands medications and regiment?  YES Patient understands to bring all medications to this visit?  YES Ask patient:  Are you enrolled in My Chart?  No, Text sent      Are you taking your pain medication? Tylenol PRN              How is your pain controlled? Pain level? No pain              If you require a refill on pain medications, know that the same medication/ amount may not be prescribed or a refill may not be given.  Please contact your pharmacy for refill requests.               Do you have help at home with ADL's? None needed

## 2023-05-07 ENCOUNTER — Ambulatory Visit: Payer: Medicaid Other | Admitting: Nurse Practitioner

## 2023-05-07 ENCOUNTER — Encounter: Payer: Self-pay | Admitting: Nurse Practitioner

## 2023-05-07 VITALS — BP 100/72 | HR 89 | Temp 97.0°F | Wt 182.4 lb

## 2023-05-07 DIAGNOSIS — R7303 Prediabetes: Secondary | ICD-10-CM | POA: Insufficient documentation

## 2023-05-07 DIAGNOSIS — I5041 Acute combined systolic (congestive) and diastolic (congestive) heart failure: Secondary | ICD-10-CM

## 2023-05-07 DIAGNOSIS — G4700 Insomnia, unspecified: Secondary | ICD-10-CM | POA: Diagnosis not present

## 2023-05-07 DIAGNOSIS — J40 Bronchitis, not specified as acute or chronic: Secondary | ICD-10-CM

## 2023-05-07 DIAGNOSIS — E785 Hyperlipidemia, unspecified: Secondary | ICD-10-CM | POA: Insufficient documentation

## 2023-05-07 DIAGNOSIS — Z09 Encounter for follow-up examination after completed treatment for conditions other than malignant neoplasm: Secondary | ICD-10-CM | POA: Insufficient documentation

## 2023-05-07 DIAGNOSIS — F1910 Other psychoactive substance abuse, uncomplicated: Secondary | ICD-10-CM | POA: Insufficient documentation

## 2023-05-07 DIAGNOSIS — Z87891 Personal history of nicotine dependence: Secondary | ICD-10-CM | POA: Insufficient documentation

## 2023-05-07 DIAGNOSIS — Z23 Encounter for immunization: Secondary | ICD-10-CM | POA: Diagnosis not present

## 2023-05-07 DIAGNOSIS — Z1211 Encounter for screening for malignant neoplasm of colon: Secondary | ICD-10-CM

## 2023-05-07 MED ORDER — SPIRONOLACTONE 25 MG PO TABS
25.0000 mg | ORAL_TABLET | Freq: Every day | ORAL | 1 refills | Status: AC
Start: 2023-05-07 — End: ?

## 2023-05-07 MED ORDER — ROSUVASTATIN CALCIUM 20 MG PO TABS
20.0000 mg | ORAL_TABLET | Freq: Every day | ORAL | 0 refills | Status: DC
Start: 2023-05-07 — End: 2023-09-03

## 2023-05-07 MED ORDER — FUROSEMIDE 40 MG PO TABS: 40.0000 mg | ORAL_TABLET | Freq: Every day | ORAL | 1 refills | Status: DC

## 2023-05-07 MED ORDER — CARVEDILOL 6.25 MG PO TABS
6.2500 mg | ORAL_TABLET | Freq: Two times a day (BID) | ORAL | 6 refills | Status: AC
Start: 2023-05-07 — End: ?

## 2023-05-07 MED ORDER — SACUBITRIL-VALSARTAN 49-51 MG PO TABS
1.0000 | ORAL_TABLET | Freq: Two times a day (BID) | ORAL | 0 refills | Status: DC
Start: 2023-05-07 — End: 2023-07-09

## 2023-05-07 MED ORDER — EMPAGLIFLOZIN 10 MG PO TABS: 10.0000 mg | ORAL_TABLET | Freq: Every day | ORAL | 1 refills | Status: DC

## 2023-05-07 MED ORDER — AMLODIPINE BESYLATE 10 MG PO TABS: 10.0000 mg | ORAL_TABLET | Freq: Every day | ORAL | 1 refills | Status: DC

## 2023-05-07 NOTE — Assessment & Plan Note (Signed)
Former smoker.  Patient encouraged to continue to abstain from smoking cigarettes

## 2023-05-07 NOTE — Assessment & Plan Note (Signed)
Patient denies shortness of breath, wheezing, cough Will repeat chest x-ray in 3 weeks

## 2023-05-07 NOTE — Patient Instructions (Addendum)
Please  go to [facility] on  Address: [address] [place], [place], [place] [address] In 2 to 3 weeks for for yoyur chest Xray .   1. Acute combined systolic and diastolic congestive heart failure (HCC)  - CBC - CMP14+EGFR - Magnesium - Phosphorus - carvedilol (COREG) 6.25 MG tablet; Take 1 tablet (6.25 mg total) by mouth 2 (two) times daily with a meal.  Dispense: 60 tablet; Refill: 6 - rosuvastatin (CRESTOR) 20 MG tablet; Take 1 tablet (20 mg total) by mouth daily.  Dispense: 90 tablet; Refill: 0 - sacubitril-valsartan (ENTRESTO) 49-51 MG; Take 1 tablet by mouth 2 (two) times daily.  Dispense: 60 tablet; Refill: 0 - spironolactone (ALDACTONE) 25 MG tablet; Take 1 tablet (25 mg total) by mouth daily.  Dispense: 90 tablet; Refill: 1  2. Bronchitis  - DG Chest 2 View; Future  3. Insomnia, unspecified type  - Home sleep test; Future       It is important that you exercise regularly at least 30 minutes 5 times a week as tolerated  Think about what you will eat, plan ahead. Choose " clean, green, fresh or frozen" over canned, processed or packaged foods which are more sugary, salty and fatty. 70 to 75% of food eaten should be vegetables and fruit. Three meals at set times with snacks allowed between meals, but they must be fruit or vegetables. Aim to eat over a 12 hour period , example 7 am to 7 pm, and STOP after  your last meal of the day. Drink water,generally about 64 ounces per day, no other drink is as healthy. Fruit juice is best enjoyed in a healthy way, by EATING the fruit.  Thanks for choosing [facility] we consider it a privelige to serve you.

## 2023-05-07 NOTE — Assessment & Plan Note (Signed)
Hospital discharge summary, labs, imaging studies reviewed Encouraged to keep upcoming appointments with cardiology

## 2023-05-07 NOTE — Assessment & Plan Note (Signed)
Lab Results  Component Value Date   HGBA1C 6.3 (H) 04/29/2023  Avoid sugar sweets soda Takes Jardiance 10 mg daily for heart failure

## 2023-05-07 NOTE — Progress Notes (Signed)
New Patient Office Visit  Subjective:  Patient ID: Matthew Warren, male    DOB: 08-29-1975  Age: 47 y.o. MRN: [id number]  CC:  Chief Complaint  Patient presents with   Hospitalization Follow-up   Establish Care    HPI Matthew Warren is a 47 y.o. male  has a past medical history of CHF (congestive heart failure) (HCC) and Hypertension.  Patient presents to establish care for his chronic medical conditions and for hospital discharge follow-up.  Patient was on admission at the hospital from 04/28/2023 to 04/30/2021 for acute combined systolic and diastolic heart failure.  Patient states that he feels like a whole new person, he has been taking all his medications daily, following a low salt heart healthy diet , monitoring his blood pressure and weight, he denies chest pain, shortness of breath, edema.  Patient complains of snoring, apnea, trouble staying asleep.  He would like a referral for sleep study to rule out sleep apnea  Cologuard ordered to screen for colon cancer Flu vaccine administered in the office today, patient encouraged to get Tdap vaccine at the pharmacy   Past Medical History:  Diagnosis Date   CHF (congestive heart failure) (HCC)    Hypertension     Past Surgical History:  Procedure Laterality Date   APPENDECTOMY      Family History  Problem Relation Age of Onset   Bipolar disorder Mother    High blood pressure Mother    Heart attack Father        heart attack at 64   Dementia Maternal Grandfather    High blood pressure Paternal Grandfather    Stroke Paternal Grandfather     Social History   Socioeconomic History   Marital status: Single    Spouse name: Not on file   Number of children: 1   Years of education: Not on file   Highest education level: High school graduate  Occupational History   Occupation: Social worker residential services  Tobacco Use   Smoking status: Former    Current packs/day: 0.00    Average packs/day: 0.3 packs/day for 5.0  years (1.3 ttl pk-yrs)    Types: Cigarettes    Start date: 2024    Quit date: 02/10/2023    Years since quitting: 0.2    Passive exposure: Never   Smokeless tobacco: Never  Vaping Use   Vaping status: Never Used  Substance and Sexual Activity   Alcohol use: Not Currently    Comment: social   Drug use: Not Currently    Types: Cocaine   Sexual activity: Yes    Birth control/protection: None  Other Topics Concern   Not on file  Social History Narrative   Lives with his son    Social Determinants of Health   Financial Resource Strain: Medium Risk (04/30/2023)   Overall Financial Resource Strain (CARDIA)    Difficulty of Paying Living Expenses: Somewhat hard  Food Insecurity: Food Insecurity Present (04/30/2023)   Hunger Vital Sign    Worried About Running Out of Food in the Last Year: Sometimes true    Ran Out of Food in the Last Year: Sometimes true  Transportation Needs: No Transportation Needs (04/29/2023)   PRAPARE - Administrator, Civil Service (Medical): No    Lack of Transportation (Non-Medical): No  Physical Activity: Not on file  Stress: Not on file  Social Connections: Unknown (12/25/2021)   [facility] from [facility]   Social Network    Social Network:  Not on file  Intimate Partner Violence: Not At Risk (04/29/2023)   Humiliation, Afraid, Rape, and Kick questionnaire    Fear of Current or Ex-Partner: No    Emotionally Abused: No    Physically Abused: No    Sexually Abused: No    ROS Review of Systems  Constitutional: Negative.   HENT: Negative.    Eyes: Negative.   Respiratory:  Positive for apnea. Negative for cough, choking and chest tightness.   Cardiovascular: Negative.  Negative for chest pain, palpitations and leg swelling.  Gastrointestinal: Negative.  Negative for abdominal distention, abdominal pain, anal bleeding and blood in stool.  Endocrine: Negative.   Genitourinary: Negative.   Musculoskeletal: Negative.   Skin:  Negative.   Neurological: Negative.   Psychiatric/Behavioral:  Positive for sleep disturbance.     Objective:   Today's Vitals: BP 100/72   Pulse 89   Temp (!) 97 F (36.1 C)   Wt 182 lb 6.4 oz (82.7 kg)   SpO2 97%   BMI 26.17 kg/m   Physical Exam Vitals and nursing note reviewed.  Constitutional:      General: He is not in acute distress.    Appearance: Normal appearance. He is not ill-appearing, toxic-appearing or diaphoretic.  HENT:     Mouth/Throat:     Mouth: Mucous membranes are moist.     Pharynx: Oropharynx is clear. No oropharyngeal exudate or posterior oropharyngeal erythema.  Eyes:     General: No scleral icterus.       Right eye: No discharge.        Left eye: No discharge.     Extraocular Movements: Extraocular movements intact.     Conjunctiva/sclera: Conjunctivae normal.  Cardiovascular:     Rate and Rhythm: Normal rate and regular rhythm.     Pulses: Normal pulses.     Heart sounds: Normal heart sounds. No murmur heard.    No friction rub. No gallop.  Pulmonary:     Effort: Pulmonary effort is normal. No respiratory distress.     Breath sounds: Normal breath sounds. No stridor. No wheezing, rhonchi or rales.  Chest:     Chest wall: No tenderness.  Abdominal:     General: There is no distension.     Palpations: Abdomen is soft.     Tenderness: There is no abdominal tenderness. There is no right CVA tenderness, left CVA tenderness or guarding.  Musculoskeletal:        General: No swelling, tenderness, deformity or signs of injury.     Right lower leg: No edema.     Left lower leg: No edema.  Skin:    General: Skin is warm and dry.     Capillary Refill: Capillary refill takes less than 2 seconds.     Coloration: Skin is not jaundiced or pale.     Findings: No bruising, erythema or lesion.  Neurological:     Mental Status: He is alert and oriented to person, place, and time.     Motor: No weakness.     Coordination: Coordination normal.     Gait:  Gait normal.  Psychiatric:        Mood and Affect: Mood normal.        Behavior: Behavior normal.        Thought Content: Thought content normal.        Judgment: Judgment normal.     Assessment & Plan:   Problem List Items Addressed This Visit  Cardiovascular and Mediastinum   Acute combined systolic and diastolic congestive heart failure (HCC) - Primary    Patient encouraged to continue his current medication Continue to monitor blood pressure and weight at home Patient counseled on DASH diet, need for moderate exercise at least 150 minutes weekly as tolerated discussed Keep upcoming appointment with cardiology  - CBC - CMP14+EGFR - Magnesium - Phosphorus - carvedilol (COREG) 6.25 MG tablet; Take 1 tablet (6.25 mg total) by mouth 2 (two) times daily with a meal.  Dispense: 60 tablet; Refill: 6 - rosuvastatin (CRESTOR) 20 MG tablet; Take 1 tablet (20 mg total) by mouth daily.  Dispense: 90 tablet; Refill: 0 - sacubitril-valsartan (ENTRESTO) 49-51 MG; Take 1 tablet by mouth 2 (two) times daily.  Dispense: 60 tablet; Refill: 0 - spironolactone (ALDACTONE) 25 MG tablet; Take 1 tablet (25 mg total) by mouth daily.  Dispense: 90 tablet; Refill: 1       Relevant Medications   amLODipine (NORVASC) 10 MG tablet   carvedilol (COREG) 6.25 MG tablet   furosemide (LASIX) 40 MG tablet   rosuvastatin (CRESTOR) 20 MG tablet   sacubitril-valsartan (ENTRESTO) 49-51 MG   spironolactone (ALDACTONE) 25 MG tablet   Other Relevant Orders   CBC   CMP14+EGFR   Magnesium   Phosphorus     Respiratory   Bronchitis    Patient denies shortness of breath, wheezing, cough Will repeat chest x-ray in 3 weeks      Relevant Orders   DG Chest 2 View     Other   Prediabetes    Lab Results  Component Value Date   HGBA1C 6.3 (H) 04/29/2023  Avoid sugar sweets soda Takes Jardiance 10 mg daily for heart failure      Insomnia    Home sleep study ordered      Relevant Orders   Home  sleep test   Substance abuse [facility])    Patient encouraged to abstain from use of illicit drugs.  He verbalized understanding      Former smoker    Former smoker.  Patient encouraged to continue to abstain from smoking cigarettes      Need for influenza vaccination    Patient educated on CDC recommendation for the vaccine. Verbal consent was obtained from the patient, vaccine administered by nurse, no sign of adverse reactions noted at this time. Patient education on arm soreness and use of tylenol for this patient  was discussed. Patient educated on the signs and symptoms of adverse effect and advise to contact the office if they occur.       Relevant Orders   Flu vaccine trivalent PF, 6mos and older(Flulaval,Afluria,Fluarix,Fluzone) (Completed)   Hospital discharge follow-up    Hospital discharge summary, labs, imaging studies reviewed Encouraged to keep upcoming appointments with cardiology      Dyslipidemia, goal LDL below 70    Lab Results  Component Value Date   CHOL 298 (H) 04/30/2023   HDL 53 04/30/2023   LDLCALC 180 (H) 04/30/2023   TRIG 323 (H) 04/30/2023   CHOLHDL 5.6 04/30/2023  Currently on 20 milligrams daily Follow-up in 4 weeks to recheck lipid panel       Relevant Medications   amLODipine (NORVASC) 10 MG tablet   carvedilol (COREG) 6.25 MG tablet   furosemide (LASIX) 40 MG tablet   rosuvastatin (CRESTOR) 20 MG tablet   sacubitril-valsartan (ENTRESTO) 49-51 MG   spironolactone (ALDACTONE) 25 MG tablet   Other Visit Diagnoses     Screening for colon  cancer       Relevant Orders   Cologuard       Outpatient Encounter Medications as of 05/07/2023  Medication Sig   [DISCONTINUED] amLODipine (NORVASC) 10 MG tablet Take 1 tablet (10 mg total) by mouth daily.   [DISCONTINUED] carvedilol (COREG) 6.25 MG tablet Take 1 tablet (6.25 mg total) by mouth 2 (two) times daily with a meal.   [DISCONTINUED] empagliflozin (JARDIANCE) 10 MG TABS tablet Take 1 tablet (10  mg total) by mouth daily.   [DISCONTINUED] furosemide (LASIX) 40 MG tablet Take 1 tablet (40 mg total) by mouth daily.   [DISCONTINUED] rosuvastatin (CRESTOR) 20 MG tablet Take 1 tablet (20 mg total) by mouth daily.   [DISCONTINUED] sacubitril-valsartan (ENTRESTO) 49-51 MG Take 1 tablet by mouth 2 (two) times daily.   [DISCONTINUED] spironolactone (ALDACTONE) 25 MG tablet Take 1 tablet (25 mg total) by mouth daily.   acetaminophen (TYLENOL) 325 MG tablet Take 2 tablets (650 mg total) by mouth every 6 (six) hours as needed for mild pain (or Fever >/= 101). (Patient not taking: Reported on 05/07/2023)   amLODipine (NORVASC) 10 MG tablet Take 1 tablet (10 mg total) by mouth daily.   carvedilol (COREG) 6.25 MG tablet Take 1 tablet (6.25 mg total) by mouth 2 (two) times daily with a meal.   empagliflozin (JARDIANCE) 10 MG TABS tablet Take 1 tablet (10 mg total) by mouth daily.   furosemide (LASIX) 40 MG tablet Take 1 tablet (40 mg total) by mouth daily.   loratadine (CLARITIN) 10 MG tablet Take 1 tablet (10 mg total) by mouth daily. (Patient not taking: Reported on 05/07/2023)   Melatonin 1 MG CAPS Take 2 mg by mouth at bedtime as needed (sleep). (Patient not taking: Reported on 05/07/2023)   ondansetron (ZOFRAN) 4 MG tablet Take 1 tablet (4 mg total) by mouth every 6 (six) hours as needed for nausea. (Patient not taking: Reported on 05/07/2023)   rosuvastatin (CRESTOR) 20 MG tablet Take 1 tablet (20 mg total) by mouth daily.   sacubitril-valsartan (ENTRESTO) 49-51 MG Take 1 tablet by mouth 2 (two) times daily.   sodium chloride (OCEAN) 0.65 % SOLN nasal spray Place 1 spray into both nostrils as needed for congestion. (Patient not taking: Reported on 05/07/2023)   spironolactone (ALDACTONE) 25 MG tablet Take 1 tablet (25 mg total) by mouth daily.   No facility-administered encounter medications on file as of 05/07/2023.    Follow-up: Return in about 4 weeks (around 06/04/2023) for HYPERLIPIDEMIA.    Donell Beers, FNP

## 2023-05-07 NOTE — Assessment & Plan Note (Signed)
Patient encouraged to abstain from use of illicit drugs.  He verbalized understanding

## 2023-05-07 NOTE — Assessment & Plan Note (Addendum)
Lab Results  Component Value Date   CHOL 298 (H) 04/30/2023   HDL 53 04/30/2023   LDLCALC 180 (H) 04/30/2023   TRIG 323 (H) 04/30/2023   CHOLHDL 5.6 04/30/2023  Currently on 20 milligrams daily Follow-up in 4 weeks to recheck lipid panel

## 2023-05-07 NOTE — Assessment & Plan Note (Signed)
Patient educated on CDC recommendation for the vaccine. Verbal consent was obtained from the patient, vaccine administered by nurse, no sign of adverse reactions noted at this time. Patient education on arm soreness and use of tylenol  for this patient  was discussed. Patient educated on the signs and symptoms of adverse effect and advise to contact the office if they occur.  ?

## 2023-05-07 NOTE — Assessment & Plan Note (Signed)
Home sleep study ordered

## 2023-05-07 NOTE — Assessment & Plan Note (Signed)
Patient encouraged to continue his current medication Continue to monitor blood pressure and weight at home Patient counseled on DASH diet, need for moderate exercise at least 150 minutes weekly as tolerated discussed Keep upcoming appointment with cardiology  - CBC - CMP14+EGFR - Magnesium - Phosphorus - carvedilol (COREG) 6.25 MG tablet; Take 1 tablet (6.25 mg total) by mouth 2 (two) times daily with a meal.  Dispense: 60 tablet; Refill: 6 - rosuvastatin (CRESTOR) 20 MG tablet; Take 1 tablet (20 mg total) by mouth daily.  Dispense: 90 tablet; Refill: 0 - sacubitril-valsartan (ENTRESTO) 49-51 MG; Take 1 tablet by mouth 2 (two) times daily.  Dispense: 60 tablet; Refill: 0 - spironolactone (ALDACTONE) 25 MG tablet; Take 1 tablet (25 mg total) by mouth daily.  Dispense: 90 tablet; Refill: 1

## 2023-05-08 LAB — CMP14+EGFR
ALT: 80 IU/L — ABNORMAL HIGH (ref 0–44)
AST: 48 IU/L — ABNORMAL HIGH (ref 0–40)
Albumin: 5.1 g/dL (ref 4.1–5.1)
Alkaline Phosphatase: 159 IU/L — ABNORMAL HIGH (ref 44–121)
BUN/Creatinine Ratio: 15 (ref 9–20)
BUN: 33 mg/dL — ABNORMAL HIGH (ref 6–24)
Bilirubin Total: 0.4 mg/dL (ref 0.0–1.2)
CO2: 20 mmol/L (ref 20–29)
Calcium: 9.8 mg/dL (ref 8.7–10.2)
Chloride: 101 mmol/L (ref 96–106)
Creatinine, Ser: 2.13 mg/dL — ABNORMAL HIGH (ref 0.76–1.27)
Globulin, Total: 3.2 g/dL (ref 1.5–4.5)
Glucose: 95 mg/dL (ref 70–99)
Potassium: 6 mmol/L — ABNORMAL HIGH (ref 3.5–5.2)
Sodium: 139 mmol/L (ref 134–144)
Total Protein: 8.3 g/dL (ref 6.0–8.5)
eGFR: 38 mL/min/{1.73_m2} — ABNORMAL LOW (ref >59)

## 2023-05-08 LAB — CBC
Hematocrit: 51.1 % — ABNORMAL HIGH (ref 37.5–51.0)
Hemoglobin: 16.7 g/dL (ref 13.0–17.7)
MCH: 29.8 pg (ref 26.6–33.0)
MCHC: 32.7 g/dL (ref 31.5–35.7)
MCV: 91 fL (ref 79–97)
Platelets: 372 10*3/uL (ref 150–450)
RBC: 5.61 x10E6/uL (ref 4.14–5.80)
RDW: 13.7 % (ref 11.6–15.4)
WBC: 10.1 10*3/uL (ref 3.4–10.8)

## 2023-05-08 LAB — MAGNESIUM: Magnesium: 2.8 mg/dL — ABNORMAL HIGH (ref 1.6–2.3)

## 2023-05-08 LAB — PHOSPHORUS: Phosphorus: 3.9 mg/dL (ref 2.8–4.1)

## 2023-05-13 ENCOUNTER — Ambulatory Visit (HOSPITAL_COMMUNITY)
Admit: 2023-05-13 | Discharge: 2023-05-13 | Disposition: A | Discharge status: Home / Self Care | Payer: Medicaid Other | Source: Ambulatory Visit | Attending: Adult Health | Admitting: Adult Health

## 2023-05-13 ENCOUNTER — Encounter (HOSPITAL_COMMUNITY): Payer: Self-pay

## 2023-05-13 VITALS — BP 140/110 | HR 85 | Wt 184.4 lb

## 2023-05-13 DIAGNOSIS — R9431 Abnormal electrocardiogram [ECG] [EKG]: Secondary | ICD-10-CM | POA: Diagnosis not present

## 2023-05-13 DIAGNOSIS — I10 Essential (primary) hypertension: Secondary | ICD-10-CM

## 2023-05-13 DIAGNOSIS — E875 Hyperkalemia: Secondary | ICD-10-CM | POA: Diagnosis not present

## 2023-05-13 DIAGNOSIS — I5042 Chronic combined systolic (congestive) and diastolic (congestive) heart failure: Secondary | ICD-10-CM | POA: Diagnosis not present

## 2023-05-13 DIAGNOSIS — N179 Acute kidney failure, unspecified: Secondary | ICD-10-CM | POA: Insufficient documentation

## 2023-05-13 DIAGNOSIS — I5032 Chronic diastolic (congestive) heart failure: Secondary | ICD-10-CM | POA: Diagnosis present

## 2023-05-13 DIAGNOSIS — I5022 Chronic systolic (congestive) heart failure: Secondary | ICD-10-CM | POA: Insufficient documentation

## 2023-05-13 DIAGNOSIS — I110 Hypertensive heart disease with heart failure: Secondary | ICD-10-CM | POA: Insufficient documentation

## 2023-05-13 LAB — BASIC METABOLIC PANEL
Anion gap: 13 (ref 5–15)
BUN: 26 mg/dL — ABNORMAL HIGH (ref 6–20)
CO2: 22 mmol/L (ref 22–32)
Calcium: 9.4 mg/dL (ref 8.9–10.3)
Chloride: 104 mmol/L (ref 98–111)
Creatinine, Ser: 1.38 mg/dL — ABNORMAL HIGH (ref 0.61–1.24)
GFR, Estimated: >60 mL/min (ref >60)
Glucose, Bld: 101 mg/dL — ABNORMAL HIGH (ref 70–99)
Potassium: 4.7 mmol/L (ref 3.5–5.1)
Sodium: 139 mmol/L (ref 135–145)

## 2023-05-13 MED ORDER — EMPAGLIFLOZIN 10 MG PO TABS: 10.0000 mg | ORAL_TABLET | Freq: Every day | ORAL | Status: DC

## 2023-05-13 MED ORDER — FUROSEMIDE 40 MG PO TABS: 40.0000 mg | ORAL_TABLET | Freq: Every day | ORAL | 1 refills | Status: DC | PRN

## 2023-05-13 NOTE — Patient Instructions (Addendum)
Labs done today. We will contact you only if your labs are abnormal.  RESTART Jardiance 10mg  (1 tablet) bym outh daily.   DECREASE Lasix to 40mg  (1 tablet) by mouth daily as needed for swelling or a weight gain of 3 pounds or more in 24 hours or 5 pounds in 1 week.   No other medication changes were made. Please continue all current medications as prescribed.  Your physician recommends that you schedule a follow-up appointment in: 4 weeks with Dr. Elwyn Lade here in our office.   If you have any questions or concerns before your next appointment please send [place] a message through [place] or call our office at [phone].    TO LEAVE A MESSAGE FOR THE NURSE SELECT OPTION 2, PLEASE LEAVE A MESSAGE INCLUDING: YOUR NAME DATE OF BIRTH CALL BACK NUMBER REASON FOR CALL**this is important as we prioritize the call backs  YOU WILL RECEIVE A CALL BACK THE SAME DAY AS LONG AS YOU CALL BEFORE 4:00 PM   Do the following things EVERYDAY: Weigh yourself in the morning before breakfast. Write it down and keep it in a log. Take your medicines as prescribed Eat low salt foods--Limit salt (sodium) to 2000 mg per day.  Stay as active as you can everyday Limit all fluids for the day to less than 2 liters   At the [facility], you and your health needs are our priority. As part of our continuing mission to provide you with exceptional heart care, we have created designated Provider Care Teams. These Care Teams include your primary Cardiologist (physician) and Advanced Practice Providers (APPs- Physician Assistants and Nurse Practitioners) who all work together to provide you with the care you need, when you need it.   You may see any of the following providers on your designated Care Team at your next follow up: Dr Arvilla Meres Dr Marca Ancona Dr. Marcos Eke, NP Robbie Lis, [place] [facility] [place], [place] Brynda Peon, NP Karle Plumber,  PharmD   Please be sure to bring in all your medications bottles to every appointment.    Thank you for choosing [facility]

## 2023-05-13 NOTE — Progress Notes (Signed)
HEART & VASCULAR TRANSITION OF CARE CONSULT NOTE     Referring Physician: Dr Rennis Golden  Primary Care: Edwin Dada NP Primary Cardiologist:  HPI: Referred to clinic by Matthew Rennis Golden for heart failure consultation.   Matthew Warren is a 47 year old with history hypertension and HFrEF. Long standing history HTN  dating back to his 19s. Previous tobacco abuse. Remote cocaine use. No recent virus.   Admitted 04/28/23 with chest pain and shortness of breath. Out of his medications x 4 months. UDS + cocaine. Respiratory panel negative.  He denies use.  Echo showed newly reduced EF 30-35%. No ischemic work up.  Diuresed with IV lasix. Plan for coronary CTA as an outpatient. Placed 4 pillars of HF. Discharged on 05/01/23.   Saw PCP NP. Renal function elevated/hyperkalemia--> creatinine 2.1 and potassium 6. Matthew Warren and Matthew Warren stopped.   Overall feeling fine. Denies SOB/PND/Orthopnea. Occasionally dizzy when standing up. SBP at home 110-120 .  Appetite ok. No fever or chills. Weight at home 179 pounds. Taking all medications. 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  Working part time. Lives with his 41 year old son.   Cardiac Testing  Echo 04/2023  1. Left ventricular ejection fraction, by estimation, is 30 to 35%. The  left ventricle has moderately decreased function. The left ventricle  demonstrates global hypokinesis. The left ventricular internal cavity size  was moderately dilated. Left  ventricular diastolic parameters are consistent with Grade III diastolic  dysfunction (restrictive). Elevated left atrial pressure.   2. Right ventricular systolic function is normal. The right ventricular  size is normal. Tricuspid regurgitation signal is inadequate for assessing  PA pressure.   3. Left atrial size was mildly dilated.   4. Right atrial size was mildly dilated.   5. The mitral valve is normal in structure. Trivial mitral valve  regurgitation.   Review of Systems: [y] = yes, [ ]  = no    General: Weight gain [ ] ; Weight loss [ ] ; Anorexia [ ] ; Fatigue [ ] ; Fever [ ] ; Chills [ ] ; Weakness [ ]   Cardiac: Chest pain/pressure [ ] ; Resting SOB [ ] ; Exertional SOB [ ] ; Orthopnea [ ] ; Pedal Edema [ ] ; Palpitations [ ] ; Syncope [ ] ; Presyncope [ ] ; Paroxysmal nocturnal dyspnea[ ]   Pulmonary: Cough [ ] ; Wheezing[ ] ; Hemoptysis[ ] ; Sputum [ ] ; Snoring [ ]   GI: Vomiting[ ] ; Dysphagia[ ] ; Melena[ ] ; Hematochezia [ ] ; Heartburn[ ] ; Abdominal pain [ ] ; Constipation [ ] ; Diarrhea [ ] ; BRBPR [ ]   GU: Hematuria[ ] ; Dysuria [ ] ; Nocturia[ ]   Vascular: Pain in legs with walking [ ] ; Pain in feet with lying flat [ ] ; Non-healing sores [ ] ; Stroke [ ] ; TIA [ ] ; Slurred speech [ ] ;  Neuro: Headaches[ ] ; Vertigo[ ] ; Seizures[ ] ; Paresthesias[ ] ;Blurred vision [ ] ; Diplopia [ ] ; Vision changes [ ]   Ortho/Skin: Arthritis [ ] ; Joint pain [ ] ; Muscle pain [ ] ; Joint swelling [ ] ; Back Pain [Y ]; Rash [ ]   Psych: Depression[ ] ; Anxiety[ ]   Heme: Bleeding problems [ ] ; Clotting disorders [ ] ; Anemia [ ]   Endocrine: Diabetes [ ] ; Thyroid dysfunction[ ]    Past Medical History:  Diagnosis Date   CHF (congestive heart failure) (HCC)    Hypertension     Current Outpatient Medications  Medication Sig Dispense Refill   acetaminophen (TYLENOL) 325 MG tablet Take 2 tablets (650 mg total) by mouth every 6 (six) hours as needed for mild pain (or Fever >/= 101). 20  tablet 0   amLODipine (NORVASC) 10 MG tablet Take 1 tablet (10 mg total) by mouth daily. 90 tablet 1   carvedilol (COREG) 6.25 MG tablet Take 1 tablet (6.25 mg total) by mouth 2 (two) times daily with a meal. 60 tablet 6   furosemide (LASIX) 40 MG tablet Take 1 tablet (40 mg total) by mouth daily. 90 tablet 1   loratadine (CLARITIN) 10 MG tablet Take 1 tablet (10 mg total) by mouth daily. 30 tablet 0   Melatonin 1 MG CAPS Take 2 mg by mouth at bedtime as needed (sleep).     rosuvastatin (CRESTOR) 20 MG tablet Take 1 tablet (20 mg total) by mouth  daily. 90 tablet 0   sacubitril-valsartan (ENTRESTO) 49-51 MG Take 1 tablet by mouth 2 (two) times daily. 60 tablet 0   sodium chloride (OCEAN) 0.65 % SOLN nasal spray Place 1 spray into both nostrils as needed for congestion. 44 mL 0   No current facility-administered medications for this encounter.    Allergies  Allergen Reactions   Codeine Other (See Comments)    Unknown.      Social History   Socioeconomic History   Marital status: Single    Spouse name: Not on file   Number of children: 1   Years of education: Not on file   Highest education level: High school graduate  Occupational History   Occupation: Social worker residential services  Tobacco Use   Smoking status: Former    Current packs/day: 0.00    Average packs/day: 0.3 packs/day for 5.0 years (1.3 ttl pk-yrs)    Types: Cigarettes    Start date: 2024    Quit date: 02/10/2023    Years since quitting: 0.2    Passive exposure: Never   Smokeless tobacco: Never  Vaping Use   Vaping status: Never Used  Substance and Sexual Activity   Alcohol use: Not Currently    Comment: social   Drug use: Not Currently    Types: Cocaine   Sexual activity: Yes    Birth control/protection: None  Other Topics Concern   Not on file  Social History Narrative   Lives with his son    Social Determinants of Health   Financial Resource Strain: Medium Risk (04/30/2023)   Overall Financial Resource Strain (CARDIA)    Difficulty of Paying Living Expenses: Somewhat hard  Food Insecurity: Food Insecurity Present (04/30/2023)   Hunger Vital Sign    Worried About Running Out of Food in the Last Year: Sometimes true    Ran Out of Food in the Last Year: Sometimes true  Transportation Needs: No Transportation Needs (04/29/2023)   PRAPARE - Administrator, Civil Service (Medical): No    Lack of Transportation (Non-Medical): No  Physical Activity: Not on file  Stress: Not on file  Social Connections: Unknown (12/25/2021)   Received  from [facility]   Social Network    Social Network: Not on file  Intimate Partner Violence: Not At Risk (04/29/2023)   Humiliation, Afraid, Rape, and Kick questionnaire    Fear of Current or Ex-Partner: No    Emotionally Abused: No    Physically Abused: No    Sexually Abused: No      Family History  Problem Relation Age of Onset   Bipolar disorder Mother    High blood pressure Mother    Heart attack Father        heart attack at 46   Dementia Maternal Grandfather  High blood pressure Paternal Grandfather    Stroke Paternal Grandfather     Vitals:   05/13/23 1522  BP: (!) 140/110  Pulse: 85  SpO2: 98%  Weight: 83.6 kg (184 lb 6.4 oz)    PHYSICAL EXAM: General:  Well appearing. No respiratory difficulty. Walked in the  HEENT: normal Neck: supple. no JVD. Carotids 2+ bilat; no bruits. No lymphadenopathy or thryomegaly appreciated. Cor: PMI nondisplaced. Regular rate & rhythm. No rubs, gallops or murmurs. Lungs: clear Abdomen: soft, nontender, nondistended. No hepatosplenomegaly. No bruits or masses. Good bowel sounds. Extremities: no cyanosis, clubbing, rash, edema Neuro: alert & oriented x 3, cranial nerves grossly intact. moves all 4 extremities w/o difficulty. Affect pleasant.  ECG:SR  78 with mod LVH.    ASSESSMENT & PLAN: 1. Chronic HFrEF Recently admitted with new acute systolic heart failure. He has not had ischemic work up. Unclear etiology. Possible due to hypertension.  Echo 04/29/23 30-35% RV ok. Plan to optimize HF and repeat ECHO 3-4 months.   NYHA II Volume status stable. Change lasix to as needed.  GDMT  BB- Continue coreg 6.25 mg twice a day  Ace/ARB/ARNI- Continue entresto 49-51 mg twice a day  MRA- Hold spironolactone given recent hyperkalemia.  SGLT2i- Restart jardiance 10 mg daily  Check BMET   2. HTN  -1st diagnosed in his 45s.  -Elevated but stable at home.  Continue current regimen.   3. AKI  -9/25 Creatinine up 2.1.  Matthew Warren and Matthew Warren stopped.  -Check BMET. Restart Jardiance 10 mg daily  -Changing lasix to as needed.   4. Hyperkalemia  -K 9/25 K 6. Repeat BMET today.   Check BMET today.   Referred to HFSW (PCP, Medications, Transportation, ETOH Abuse, Drug Abuse, Insurance, Financial ): No Refer to Pharmacy: Yes  Refer to Home Health: No Refer to Advanced Heart Failure Clinic: Yes Matthew Warren  Refer to General Cardiology: Yes   Follow up 4 weeks with Matthew Warren as a new patient.   Matthew Cicio NP-C 3:38 PM

## 2023-05-21 ENCOUNTER — Encounter: Payer: Self-pay | Admitting: Student

## 2023-05-26 NOTE — Progress Notes (Unsigned)
Cardiology Office Note:    Date:  05/27/2023  ID:  Matthew Warren, DOB 03-12-1976, MRN [id number] PCP: Donell Beers, FNP  [facility] Providers Cardiologist:  Chrystie Nose, MD       Patient Profile:      Heart failure with reduced ejection fraction Echo 04/29/2023: LVEF 30 to 35%.  LV moderately decreased function.  Global hypokinesis.  Grade 3 diastolic dysfunction.  Elevated left atrial pressure.  RVF normal. L/R atrial size mildly dilated. Trivial mitral valve regurgitation.  Hypertension Hyperlipidemia Tobacco abuse Substance abuse      History of Present Illness:   Matthew Warren is a 47 y.o. male who returns for follow-up for new onset heart failure with reduced ejection fraction.  He was admitted to the hospital on 04/29/2023 for new-onset CHF, discharged on 05/01/2023.  History of uncontrolled hypertension and noncompliance with medications.  Presented to the ED with dyspnea on exertion and cough, found to be in acute CHF.  He is a prior smoker, history of cocaine abuse.  Admission lab work showed a BNP of 1261 and blood pressures in the 180s over 120s.  Previously noncompliant with amlodipine, due to financial restraints.  CT scan of chest showed pulmonary edema.  He was started on IV Lasix.    His echocardiogram showed an LVEF of 30 to 35% with global hypokinesis.  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  No evidence of coronary calcifications on CT scan, he responded well to diuretics during admission.  GDMT for heart failure was started including carvedilol 6.25 mg twice daily, spironolactone 25 mg daily, Jardiance 10 mg, Entresto 49-51mg , Lasix 40mg .  Outpatient coronary CT was recommended once his blood pressure was better controlled to assess for ischemic causes.  Etiology of heart failure was unclear at time of discharge with history of heart disease,  previous drug use, uncontrolled hypertension.  Outpatient sleep study was noted for suspected OSA leading to uncontrolled hypertension.  He was started on rosuvastatin 20 mg for LDL of 180.  His UDS was positive for cocaine during admission.  Was seen on 05/07/2019 for to establish care with a new PCP.  At home sleep study was ordered at the time.  Repeat BMP showed a creatinine level of 2.1 and potassium of 6, PCP discontinued Jardiance and spironolactone.  He denied shortness of breath, PND, orthopnea at the time.  Seen by heart and vascular transition of care on 05/13/2019. Jardiance 10 mg was restarted at this time, Lasix was changed to as needed.  Repeat BMP on 05/13/2023 showed a creatinine level of 1.38 and normalized potassium level of 4.7.  Today in clinic he is doing well from a cardiac standpoint.  He notes that he is back to work in [employer] has decreased his hours from 8-6.  He notes he would like to to start working out again and wants to start running.  He notes that he has changed his eating habits, he is no longer eating gas station food and cooking for himself.  He does note slight 3 pound weight gain and ongoing cough.  He thinks the cough is coming from fluid that has bulit up in his lungs. Cough is dry. He has a repeat chest x-ray with his PCP to rule out bronchitis/pneumonia.  He denies leg swelling or abdominal bloating.  He is now able to lie flat.  He has been sent a  home sleep study to rule out sleep apnea by his PCP, he has not started it yet but plans to this week.  He does note that he is not able to sleep throughout the night.  He notes that he has stopped smoking.  No more drug use.   His blood pressures at home has been in the 120s, he is keeping a log.  He notes an episode of dizziness while standing.  He denies any chest pain, shortness of breath, generalized edema.  Overall he notes that he feels like a brand-new person and plans to continue to eat healthy and workout.  It  was noted that he has not been taking any lasix at home.           Review of Systems  Constitutional: Positive for weight gain. Negative for fever and weight loss.  Cardiovascular:  Negative for chest pain, claudication, dyspnea on exertion, irregular heartbeat, leg swelling, near-syncope, orthopnea, palpitations, paroxysmal nocturnal dyspnea and syncope.  Respiratory:  Positive for cough and snoring. Negative for hemoptysis, shortness of breath, sleep disturbances due to breathing and wheezing.   Neurological:  Positive for excessive daytime sleepiness. Negative for headaches and light-headedness.     See HPI     Studies Reviewed:       Echocardiogram 04/29/2023   1. Left ventricular ejection fraction, by estimation, is 30 to 35%. The  left ventricle has moderately decreased function. The left ventricle  demonstrates global hypokinesis. The left ventricular internal cavity size  was moderately dilated. Left  ventricular diastolic parameters are consistent with Grade III diastolic  dysfunction (restrictive). Elevated left atrial pressure.   2. Right ventricular systolic function is normal. The right ventricular  size is normal. Tricuspid regurgitation signal is inadequate for assessing  PA pressure.   3. Left atrial size was mildly dilated.   4. Right atrial size was mildly dilated.   5. The mitral valve is normal in structure. Trivial mitral valve  regurgitation.   6. The aortic valve is tricuspid. Aortic valve regurgitation is not  visualized. No aortic stenosis is present.   7. The inferior vena cava is dilated in size with <50% respiratory  variability, suggesting right atrial pressure of 15 mmHg.   Risk Assessment/Calculations:             Physical Exam:   VS:  BP 124/84 (BP Location: Left Arm, Patient Position: Sitting, Cuff Size: Normal)   Pulse 76   Ht 5\' 10"  (1.778 m)   Wt 185 lb 3.2 oz (84 kg)   SpO2 97%   BMI 26.57 kg/m    Wt Readings from Last 3 Encounters:   05/27/23 185 lb 3.2 oz (84 kg)  05/13/23 184 lb 6.4 oz (83.6 kg)  05/07/23 182 lb 6.4 oz (82.7 kg)    Constitutional:      Appearance: Normal and healthy appearance.  Neck:     Vascular: JVD normal.  Pulmonary:     Effort: Pulmonary effort is normal.     Breath sounds: Normal breath sounds.  Chest:     Chest wall: Not tender to palpatation.  Cardiovascular:     PMI at left midclavicular line. Normal rate. Regular rhythm. Normal S1. Normal S2.      Murmurs: There is no murmur.     No gallop.  No click. No rub.  Pulses:    Intact distal pulses.  Edema:    Peripheral edema absent.  Musculoskeletal:     Cervical back: Neck supple.  Skin:    General: Skin is warm.  Neurological:     General: No focal deficit present.     Mental Status: Oriented to person, place and time.  Psychiatric:        Behavior: Behavior is cooperative.        Assessment and Plan:  Heart failure with reduced ejection fraction -Echo 9/24, LVEF 30 to 35% with global hypokinesis.  Grade 3 diastolic dysfunction.  Normal right ventricular function -Euvolemic and well compensated on exam.  NYHA II -Mild 3lb weight gain with associated on-going cough.  -Plan to restart start Lasix 20 mg as needed -Due to recent hyperkalemia, we will continue to hold spironolactone and not increase Entresto at this time.  Can adjust after repeat echo.  -Repeat echocardiogram 3 months -Continue Coreg 6.25 mg twice a day, Entresto 49-51 mg twice a day, Jardiance 10 mg once daily -Plan to order a coronary CTA to rule out ischemic causes for HF  2.  Hypertension -Blood pressure today 124/84, currently controlled in office and at home -Continue amlodipine 10 mg daily, Coreg 6.25 mg twice daily -Home sleep study to be completed with PCP, hopefully this week  3.  Hyperlipidemia -LDL 180 on 04/30/2023 -Was started on rosuvastatin 20 mg x 4 weeks ago -Plan to repeat CMP, lipid panel in 2 weeks -Will adjust as needed, will assess  LFTs -Continue rosuvastatin 20 mg once daily  4.  Acute kidney injury -Creatinine 1.38 on 10/1 -Could be underlying due to longstanding hypertension -Continue Jardiance 10 mg daily -Repeat CMP x 1 to 2 weeks               Dispo:  Return in about 3 months (around 08/27/2023) for Routine Folow Up.  Signed, Denyce Robert, AGNP-C

## 2023-05-27 ENCOUNTER — Ambulatory Visit: Payer: Medicaid Other | Attending: General Practice | Admitting: Emergency Medicine

## 2023-05-27 ENCOUNTER — Encounter: Payer: Self-pay | Admitting: General Practice

## 2023-05-27 VITALS — BP 124/84 | HR 76 | Ht 70.0 in | Wt 185.2 lb

## 2023-05-27 DIAGNOSIS — I10 Essential (primary) hypertension: Secondary | ICD-10-CM | POA: Diagnosis not present

## 2023-05-27 DIAGNOSIS — I5041 Acute combined systolic (congestive) and diastolic (congestive) heart failure: Secondary | ICD-10-CM

## 2023-05-27 DIAGNOSIS — N179 Acute kidney failure, unspecified: Secondary | ICD-10-CM

## 2023-05-27 DIAGNOSIS — E782 Mixed hyperlipidemia: Secondary | ICD-10-CM

## 2023-05-27 MED ORDER — FUROSEMIDE 20 MG PO TABS: 20.0000 mg | ORAL_TABLET | ORAL | 1 refills | Status: DC | PRN

## 2023-05-27 NOTE — Patient Instructions (Signed)
Medication Instructions:  TAKE YOUR LASIX 20MG  ONLY AS NEEDED FOR SWELLING *If you need a refill on your cardiac medications before your next appointment, please call your pharmacy*  Lab Work: FASTING LIPID AND CMET IN 1 WEEK If you have labs (blood work) drawn today and your tests are completely normal, you will receive your results only by:  MyChart Message (if you have MyChart) OR  A paper copy in the mail If you have any lab test that is abnormal or we need to change your treatment, we will call you to review the results.  Testing/Procedures: Your physician has requested that you have an echocardiogram-IN 3 MONTHS. Echocardiography is a painless test that uses sound waves to create images of your heart. It provides your doctor with information about the size and shape of your heart and how well your heart's chambers and valves are working. This procedure takes approximately one hour. There are no restrictions for this procedure. Please do NOT wear cologne, perfume, aftershave, or lotions (deodorant is allowed). Please arrive 15 minutes prior to your appointment time.   CT Angiography (CTA), is a special type of CT scan that uses a computer to produce multi-dimensional views of major blood vessels throughout the body. In CT angiography, a contrast material is injected through an IV to help visualize the blood vessels  Follow-Up: At [facility], you and your health needs are our priority.  As part of our continuing mission to provide you with exceptional heart care, we have created designated Provider Care Teams.  These Care Teams include your primary Cardiologist (physician) and Advanced Practice Providers (APPs -  Physician Assistants and Nurse Practitioners) who all work together to provide you with the care you need, when you need it.  Your next appointment:   3 month(s)-MAKE SURE THIS IS AFTER UPCOMING ECHO  Provider:   Chrystie Nose, MD     Other Instructions PLEASE READ  AND FOLLOW ATTACHED  SALTY 6   CONTINUE YOUR PHYSICAL ACTIVITY-AS TOLERATED FLUID RESTRICTION (WATER) 64 OUNCES DAILY MAKE SURE TO LET us KNOW THE RESULTS FROM YOUR SLEEP STUDY DONE WITH YOUR PRIMARY CARE

## 2023-06-04 ENCOUNTER — Ambulatory Visit (HOSPITAL_BASED_OUTPATIENT_CLINIC_OR_DEPARTMENT_OTHER): Payer: Medicaid Other | Attending: Nurse Practitioner | Admitting: Internal Medicine

## 2023-06-04 ENCOUNTER — Ambulatory Visit: Payer: Self-pay | Admitting: Nurse Practitioner

## 2023-06-09 ENCOUNTER — Other Ambulatory Visit (HOSPITAL_COMMUNITY): Payer: Self-pay | Admitting: Cardiology

## 2023-06-09 ENCOUNTER — Other Ambulatory Visit: Payer: Self-pay

## 2023-06-09 ENCOUNTER — Telehealth: Payer: Self-pay

## 2023-06-09 MED ORDER — EMPAGLIFLOZIN 10 MG PO TABS: 10.0000 mg | ORAL_TABLET | Freq: Every day | ORAL | 6 refills | Status: AC

## 2023-06-09 NOTE — Telephone Encounter (Signed)
Good after noon Tresa Endo, can you give me a hand with this prior auth please.   Jardiance 10MG  Tablets.

## 2023-06-11 ENCOUNTER — Encounter (HOSPITAL_COMMUNITY): Payer: Medicaid Other | Admitting: Cardiology

## 2023-07-05 ENCOUNTER — Other Ambulatory Visit: Payer: Self-pay | Admitting: Nurse Practitioner

## 2023-07-05 DIAGNOSIS — I5041 Acute combined systolic (congestive) and diastolic (congestive) heart failure: Secondary | ICD-10-CM

## 2023-07-09 NOTE — Telephone Encounter (Signed)
Please review for refill. Are we managing Entresto? Looks like filled by pcp. Thanks

## 2023-07-15 ENCOUNTER — Telehealth (HOSPITAL_COMMUNITY): Payer: Self-pay | Admitting: Emergency Medicine

## 2023-07-15 DIAGNOSIS — R079 Chest pain, unspecified: Secondary | ICD-10-CM

## 2023-07-15 MED ORDER — METOPROLOL TARTRATE 100 MG PO TABS
100.0000 mg | ORAL_TABLET | Freq: Once | ORAL | 0 refills | Status: AC
Start: 2023-07-15 — End: 2023-07-15

## 2023-07-15 NOTE — Telephone Encounter (Signed)
Unable to leave vm Mychart sent with rx for 100mg  metoprolol  Rockwell Alexandria RN Navigator Cardiac Imaging [facility] [phone] Office  [phone] Cell

## 2023-07-16 ENCOUNTER — Ambulatory Visit (HOSPITAL_COMMUNITY): Admission: RE | Admit: 2023-07-16 | Payer: Medicaid Other | Source: Ambulatory Visit

## 2023-07-22 ENCOUNTER — Encounter (HOSPITAL_COMMUNITY): Payer: Self-pay

## 2023-08-25 ENCOUNTER — Ambulatory Visit (HOSPITAL_COMMUNITY): Payer: Medicaid Other | Attending: Emergency Medicine

## 2023-08-25 ENCOUNTER — Encounter (HOSPITAL_COMMUNITY): Payer: Self-pay | Admitting: Emergency Medicine

## 2023-09-03 ENCOUNTER — Other Ambulatory Visit: Payer: Self-pay | Admitting: Nurse Practitioner

## 2023-09-03 DIAGNOSIS — I5041 Acute combined systolic (congestive) and diastolic (congestive) heart failure: Secondary | ICD-10-CM

## 2024-04-15 ENCOUNTER — Emergency Department (HOSPITAL_COMMUNITY)
Admission: EM | Admit: 2024-04-15 | Discharge: 2024-04-15 | Disposition: A | Discharge status: Home / Self Care | Attending: Emergency Medicine | Admitting: Emergency Medicine

## 2024-04-15 ENCOUNTER — Other Ambulatory Visit: Payer: Self-pay

## 2024-04-15 ENCOUNTER — Emergency Department (HOSPITAL_COMMUNITY)

## 2024-04-15 ENCOUNTER — Encounter (HOSPITAL_COMMUNITY): Payer: Self-pay

## 2024-04-15 DIAGNOSIS — S6710XA Crushing injury of unspecified finger(s), initial encounter: Secondary | ICD-10-CM

## 2024-04-15 DIAGNOSIS — S67193A Crushing injury of left middle finger, initial encounter: Secondary | ICD-10-CM | POA: Diagnosis not present

## 2024-04-15 DIAGNOSIS — S62663A Nondisplaced fracture of distal phalanx of left middle finger, initial encounter for closed fracture: Secondary | ICD-10-CM | POA: Insufficient documentation

## 2024-04-15 DIAGNOSIS — Z23 Encounter for immunization: Secondary | ICD-10-CM | POA: Insufficient documentation

## 2024-04-15 DIAGNOSIS — I110 Hypertensive heart disease with heart failure: Secondary | ICD-10-CM | POA: Insufficient documentation

## 2024-04-15 DIAGNOSIS — S6992XA Unspecified injury of left wrist, hand and finger(s), initial encounter: Secondary | ICD-10-CM | POA: Diagnosis present

## 2024-04-15 DIAGNOSIS — W231XXA Caught, crushed, jammed, or pinched between stationary objects, initial encounter: Secondary | ICD-10-CM | POA: Diagnosis not present

## 2024-04-15 DIAGNOSIS — Y990 Civilian activity done for income or pay: Secondary | ICD-10-CM | POA: Diagnosis not present

## 2024-04-15 DIAGNOSIS — I509 Heart failure, unspecified: Secondary | ICD-10-CM | POA: Diagnosis not present

## 2024-04-15 DIAGNOSIS — S61313A Laceration without foreign body of left middle finger with damage to nail, initial encounter: Secondary | ICD-10-CM

## 2024-04-15 MED ORDER — CARVEDILOL 3.125 MG PO TABS
6.2500 mg | ORAL_TABLET | Freq: Once | ORAL | Status: AC
  Administered 2024-04-15: 6.25 mg via ORAL
  Filled 2024-04-15: qty 2

## 2024-04-15 MED ORDER — OXYCODONE-ACETAMINOPHEN 5-325 MG PO TABS: 1.0000 | ORAL_TABLET | Freq: Four times a day (QID) | ORAL | 0 refills | Status: AC | PRN

## 2024-04-15 MED ORDER — LIDOCAINE HCL (PF) 1 % IJ SOLN
10.0000 mL | Freq: Once | INTRAMUSCULAR | Status: DC
  Filled 2024-04-15: qty 10

## 2024-04-15 MED ORDER — AMLODIPINE BESYLATE 5 MG PO TABS
10.0000 mg | ORAL_TABLET | Freq: Once | ORAL | Status: AC
  Administered 2024-04-15: 10 mg via ORAL
  Filled 2024-04-15: qty 2

## 2024-04-15 MED ORDER — TETANUS-DIPHTH-ACELL PERTUSSIS 5-2.5-18.5 LF-MCG/0.5 IM SUSY
0.5000 mL | PREFILLED_SYRINGE | Freq: Once | INTRAMUSCULAR | Status: AC
  Administered 2024-04-15: 0.5 mL via INTRAMUSCULAR
  Filled 2024-04-15: qty 0.5

## 2024-04-15 MED ORDER — OXYCODONE-ACETAMINOPHEN 5-325 MG PO TABS
1.0000 | ORAL_TABLET | Freq: Once | ORAL | Status: AC
  Administered 2024-04-15: 1 via ORAL
  Filled 2024-04-15: qty 1

## 2024-04-15 MED ORDER — LIDOCAINE HCL (PF) 1 % IJ SOLN
5.0000 mL | Freq: Once | INTRAMUSCULAR | Status: AC
  Administered 2024-04-15: 5 mL
  Filled 2024-04-15: qty 5

## 2024-04-15 NOTE — ED Notes (Signed)
 EDP notified of elevated BP reading upon discharge. EDP says ok to discharge.

## 2024-04-15 NOTE — ED Triage Notes (Signed)
 Patient reports a lot of boards dropped on left hand and middle finger took most of the weight which caused a crush injury.  Patient very hypertensive in triage but didn't take his BP meds this morning.

## 2024-04-15 NOTE — Discharge Instructions (Addendum)
 You were seen here for an injury to your left middle finger. You have a small fracture to the tip of that finger. You also had a nail injury that we were able to repair. Please follow up with Dr. Arlinda with hand surgery. Call [phone]  to set up a follow up appointment within the next few days.   Come back to the ED if you have severe pain, significant bleeding, note thick/white discharge coming from the wound, or if you have any other reason to believe you need emergency medical care

## 2024-04-15 NOTE — ED Provider Notes (Incomplete)
 [place] [facility] AT [facility] Provider Note HPI Matthew Warren is a 48 y.o. male with a medical history of hypertension who presents to the emergency department after crush injury to the left hand.  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  He dropped a large pallet and attempted to catch it when his left fingers got caught between the pallet and a wooden box.  He denies other injuries.  He is not on any antiplatelets or anticoagulants  Past Medical History:  Diagnosis Date   CHF (congestive heart failure) (HCC)    Hypertension    Past Surgical History:  Procedure Laterality Date   APPENDECTOMY      Review of Systems Pertinent positives and negative findings are listed as part of the History of Present Illness and MDM  Physical Exam Vitals:   04/15/24 1315 04/15/24 1332 04/15/24 1427 04/15/24 1834  BP:  (!) 219/154 (!) 234/145 (!) 211/141  Pulse:   (!) 101 95  Resp:    16  Temp:    98.4 F (36.9 C)  TempSrc:    Oral  SpO2:   98% 98%  Weight: 83.9 kg     Height: 5' 10 (1.778 m)        Constitutional Nursing notes reviewed Vital signs reviewed  HEENT No obvious trauma Pupils cross midline  Respiratory Effort normal Breathing well on room air  CV Normal rate and rhythm   MSK Laceration to the palmar aspect of the left third digit Partial fingernail avulsion on the third digit of the left hand that is raised from the germinal matrix and overlies the eponychial fold Intact flexion and extension at the MCP, PIP and DIP Sensation light touch intact  Neuro Awake and alert Pupils cross midline Moving all extremities    MDM:  Initial Differential Diagnoses includes fracture, dislocation, soft tissue injury, nail avulsion  I reviewed the patient's vitals, the nursing triage note and evaluated the patient at bedside.  Patient presents with a crush injury to the left middle finger.  X-rays reviewed by myself show a small  fracture involving the distal tip of the left middle finger.  He is neurovascularly intact as detailed above with no evidence of tendon injury.  I performed digital block and thoroughly washed out the wound with soap and water.  I repaired his palmar wound with 3 nonabsorbable sutures.    Given nail avulsion with nail partially pulled from the germinal matrix, I removed his nail and stented the patient's eponychial fold using a piece of suture packing and sutured it down.  No evidence of nailbed injury or laceration to suggest an open fracture.   Patient's wound was dressed with Xeroform and a gauze dressing.  He was placed in a finger splint.  He will follow-up with hand surgery for reevaluation.  He was given Percocet, Tdap and his home blood pressure medications.     Procedures: Nail Removal  Date/Time: 04/16/2024 12:01 AM  Performed by: Dionisio Blunt, MD Authorized by: Emil Share, DO   Consent:    Consent obtained:  Verbal   Consent given by:  Patient   Risks, benefits, and alternatives were discussed: yes     Risks discussed:  Bleeding, incomplete removal, infection, pain and permanent nail deformity   Alternatives discussed:  No treatment, delayed treatment and alternative treatment Universal protocol:    Procedure explained and questions answered to patient or proxy's satisfaction: yes     Relevant documents present  and verified: yes     Test results available: yes     Imaging studies available: yes     Required blood products, implants, devices, and special equipment available: yes     Patient identity confirmed:  Arm band and hospital-assigned identification number Pre-procedure details:    Skin preparation:  Chlorhexidine Procedure details:    Location:  Hand   Hand location:  L long finger Anesthesia:    Anesthesia method:  Nerve block   Block needle gauge:  27 G   Block anesthetic:  Lidocaine  1% w/o epi   Block injection procedure:  Anatomic landmarks identified,  introduced needle, negative aspiration for blood, incremental injection and anatomic landmarks palpated   Block outcome:  Anesthesia achieved Nail Removal:    Nail removed:  Complete   Nail bed repaired: no     Removed nail replaced and anchored: yes     Stented with:  Suture packing Post-procedure details:    Dressing:  Splint, gauze roll and Xeroform gauze   Procedure completion:  Tolerated well, no immediate complications .Laceration Repair  Date/Time: 04/16/2024 12:02 AM  Performed by: Dionisio Blunt, MD Authorized by: Emil Share, DO   Consent:    Consent obtained:  Verbal   Consent given by:  Patient   Risks, benefits, and alternatives were discussed: yes     Risks discussed:  Pain, poor cosmetic result, poor wound healing, nerve damage, need for additional repair, infection, tendon damage and vascular damage   Alternatives discussed:  No treatment Universal protocol:    Procedure explained and questions answered to patient or proxy's satisfaction: yes     Relevant documents present and verified: yes     Test results available: yes     Imaging studies available: yes     Required blood products, implants, devices, and special equipment available: yes     Patient identity confirmed:  Arm band Anesthesia:    Anesthesia method:  Nerve block   Block needle gauge:  27 G   Block anesthetic:  Lidocaine  1% w/o epi   Block injection procedure:  Anatomic landmarks identified, anatomic landmarks palpated, introduced needle, negative aspiration for blood and incremental injection   Block outcome:  Anesthesia achieved Laceration details:    Location:  Finger   Finger location:  L long finger   Length (cm):  2   Depth (mm):  5 Pre-procedure details:    Preparation:  Imaging obtained to evaluate for foreign bodies Exploration:    Limited defect created (wound extended): no     Hemostasis achieved with:  Tourniquet   Imaging obtained: x-ray     Imaging outcome: foreign body not  noted     Wound exploration: wound explored through full range of motion and entire depth of wound visualized     Contaminated: no   Treatment:    Area cleansed with:  Soap and water   Amount of cleaning:  Extensive   Irrigation solution:  Tap water   Irrigation method:  Tap   Debridement:  None   Undermining:  None   Scar revision: no   Skin repair:    Repair method:  Sutures   Suture size:  4-0   Suture material:  Prolene   Suture technique:  Simple interrupted   Number of sutures:  3 Approximation:    Approximation:  Close Repair type:    Repair type:  Simple Post-procedure details:    Dressing:  Antibiotic ointment, non-adherent dressing and splint for protection  Procedure completion:  Tolerated   Medications administered in the ED: Medications  oxyCODONE -acetaminophen  (PERCOCET/ROXICET) 5-325 MG per tablet 1 tablet (1 tablet Oral Given 04/15/24 1524)  lidocaine  (PF) (XYLOCAINE ) 1 % injection 5 mL (5 mLs Other Given 04/15/24 1524)  carvedilol  (COREG ) tablet 6.25 mg (6.25 mg Oral Given 04/15/24 1523)  amLODipine  (NORVASC ) tablet 10 mg (10 mg Oral Given 04/15/24 1524)  Tdap (BOOSTRIX ) injection 0.5 mL (0.5 mLs Intramuscular Given 04/15/24 1524)     Impression: 1. Laceration of left middle finger without foreign body with damage to nail, initial encounter   2. Crushing injury of finger, initial encounter   3. Closed nondisplaced fracture of distal phalanx of left middle finger, initial encounter      Patient's presentation is most consistent with acute presentation with potential threat to life or bodily function.  Disposition: ED Disposition:  Discharge   Discharge: Patient is felt to be medically appropriate for discharge at this time. Patient was instructed to follow up with their primary care doctor/specialists listed above for re-evaluation. Patient was given strict return precautions.  ED Discharge Orders          Ordered    oxyCODONE -acetaminophen  (PERCOCET/ROXICET)  5-325 MG tablet  Every 6 hours PRN        04/15/24 1814                  Dionisio Blunt, MD 04/16/24 0006    Emil Share, DO 04/16/24 1458    Dionisio Blunt, MD 04/16/24 1620    Emil Share, DO 04/16/24 1739

## 2024-04-15 NOTE — ED Triage Notes (Signed)
 Pt here from work with c/o crush injury to the left hand ,

## 2024-04-16 NOTE — ED Provider Notes (Incomplete)
 [place] [facility] AT [facility] Provider Note HPI Male Matthew Warren is a 48 y.o. male with a medical history of hypertension who presents to the emergency department after crush injury to the left hand.  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  He dropped a large pallet and attempted to catch it when his left fingers got caught between the pallet and a wooden box.  He denies other injuries.  He is not on any antiplatelets or anticoagulants  Past Medical History:  Diagnosis Date  . CHF (congestive heart failure) (HCC)   . Hypertension    Past Surgical History:  Procedure Laterality Date  . APPENDECTOMY      Review of Systems Pertinent positives and negative findings are listed as part of the History of Present Illness and MDM  Physical Exam Vitals:   04/15/24 1315 04/15/24 1332 04/15/24 1427 04/15/24 1834  BP:  (!) 219/154 (!) 234/145 (!) 211/141  Pulse:   (!) 101 95  Resp:    16  Temp:    98.4 F (36.9 C)  TempSrc:    Oral  SpO2:   98% 98%  Weight: 83.9 kg     Height: 5' 10 (1.778 m)        Constitutional Nursing notes reviewed Vital signs reviewed  HEENT No obvious trauma Pupils cross midline  Respiratory Effort normal Breathing well on room air  CV Normal rate and rhythm   MSK Laceration to the plantar aspect of the left third digit Partial fingernail avulsion on the third digit of the left hand that is raised from the germinal matrix. Intact flexion and extension at the MCP, PIP and DIP Sensation light touch intact  Neuro Awake and alert Pupils cross midline Moving all extremities    MDM:  Initial Differential Diagnoses includes fracture, dislocation, soft tissue injury, nail avulsion  I reviewed the patient's vitals, the nursing triage note and evaluated the patient at bedside.  Patient presents with a crush injury to the left middle finger.  X-rays reviewed by myself show a small fracture involving the distal  tip of the left middle finger.  He is neurovascularly intact as detailed above with no evidence of tendon injury.  I performed digital block and thoroughly washed out the wound.  I repaired his palmar wound with 3 nonabsorbable sutures.  Given significant nail damage, I removed his nail and stented the patient's germinal matrix using a piece of suture packing and sutured it down.  Patient's wound was dressed with Xeroform and a gauze dressing.  He was placed in a finger splint.  He will follow-up with hand surgery for reevaluation.  He was giveng  Percocet, Tdap and his home blood pressure medications.     Procedures: Nail Removal  Date/Time: 04/16/2024 12:01 AM  Performed by: Dionisio Blunt, MD Authorized by: Emil Share, DO   Consent:    Consent obtained:  Verbal   Consent given by:  Patient   Risks, benefits, and alternatives were discussed: yes     Risks discussed:  Bleeding, incomplete removal, infection, pain and permanent nail deformity   Alternatives discussed:  No treatment, delayed treatment and alternative treatment Universal protocol:    Procedure explained and questions answered to patient or proxy's satisfaction: yes     Relevant documents present and verified: yes     Test results available: yes     Imaging studies available: yes     Required blood products, implants, devices, and special equipment  available: yes     Patient identity confirmed:  Arm band and hospital-assigned identification number Pre-procedure details:    Skin preparation:  Chlorhexidine Procedure details:    Location:  Hand   Hand location:  L long finger Anesthesia:    Anesthesia method:  Nerve block   Block needle gauge:  27 G   Block anesthetic:  Lidocaine  1% w/o epi   Block injection procedure:  Anatomic landmarks identified, introduced needle, negative aspiration for blood, incremental injection and anatomic landmarks palpated   Block outcome:  Anesthesia achieved Nail Removal:    Nail  removed:  Complete   Nail bed repaired: no     Removed nail replaced and anchored: yes     Stented with:  Suture packing Post-procedure details:    Dressing:  Splint, gauze roll and Xeroform gauze   Procedure completion:  Tolerated well, no immediate complications .Laceration Repair  Date/Time: 04/16/2024 12:02 AM  Performed by: Dionisio Blunt, MD Authorized by: Emil Share, DO   Consent:    Consent obtained:  Verbal   Consent given by:  Patient   Risks, benefits, and alternatives were discussed: yes     Risks discussed:  Pain, poor cosmetic result, poor wound healing, nerve damage, need for additional repair, infection, tendon damage and vascular damage   Alternatives discussed:  No treatment   Medications administered in the ED: Medications  oxyCODONE -acetaminophen  (PERCOCET/ROXICET) 5-325 MG per tablet 1 tablet (1 tablet Oral Given 04/15/24 1524)  lidocaine  (PF) (XYLOCAINE ) 1 % injection 5 mL (5 mLs Other Given 04/15/24 1524)  carvedilol  (COREG ) tablet 6.25 mg (6.25 mg Oral Given 04/15/24 1523)  amLODipine  (NORVASC ) tablet 10 mg (10 mg Oral Given 04/15/24 1524)  Tdap (BOOSTRIX ) injection 0.5 mL (0.5 mLs Intramuscular Given 04/15/24 1524)     Impression: 1. Laceration of left middle finger without foreign body with damage to nail, initial encounter   2. Crushing injury of finger, initial encounter   3. Closed nondisplaced fracture of distal phalanx of left middle finger, initial encounter      Patient's presentation is most consistent with {EM COPA:27473}  Disposition: ED Disposition:  Discharge   ***Discharge: Patient is felt to be medically appropriate for discharge at this time. Patient was instructed to follow up with their primary care doctor/specialists listed above for re-evaluation. Patient was given strict return precautions.  ED Discharge Orders          Ordered    oxyCODONE -acetaminophen  (PERCOCET/ROXICET) 5-325 MG tablet  Every 6 hours PRN        04/15/24 1814

## 2024-04-19 ENCOUNTER — Ambulatory Visit (INDEPENDENT_AMBULATORY_CARE_PROVIDER_SITE_OTHER): Admitting: Orthopedic Surgery

## 2024-04-19 ENCOUNTER — Inpatient Hospital Stay: Admitting: Nurse Practitioner

## 2024-04-19 DIAGNOSIS — S6992XA Unspecified injury of left wrist, hand and finger(s), initial encounter: Secondary | ICD-10-CM | POA: Diagnosis not present

## 2024-04-19 NOTE — Progress Notes (Unsigned)
 [name] - 48 y.o. male MRN [id number]  Date of birth: 03/17/1976  Office Visit Note: Visit Date: 04/19/2024 PCP: Paseda, Folashade R, FNP Referred by: Paseda, Folashade R, FNP  Subjective: No chief complaint on file.  HPI: Matthew Warren is a pleasant 48 y.o. male who presents today for evaluation of a left long finger crush injury to the distal aspect of the digit that occurred approximately 4 days prior.  Injury mechanism described as a crush injury from a large pallet while working.  He works in [employer].  He is left-hand dominant.  Overall healthy and active at baseline.  Former smoker.  He was initially seen in the emergency department setting after the injury, underwent clinical and radiographic workup which showed a minimal distal phalanx fracture with associated nailbed injury.  He underwent bedside irrigation and repair of the nailbed injury with application of splint.  He was given subsequent hand surgical follow-up.  Pertinent ROS were reviewed with the patient and found to be negative unless otherwise specified above in HPI.   Visit Reason: left middle finger nail bed injury Duration of symptoms: 4 days  Hand dominance: left Occupation: Chiropractor Diabetic: No Smoking: No, former Heart/Lung History:  Acute combined systolic and diastolic congestive heart failure (HCC) Hypertensive urgency Uncontrolled hypertension   Blood Thinners: none  Prior Testing/EMG: x-rays Injections (Date): none Treatments: splint Prior Surgery: none  Assessment & Plan: Visit Diagnoses:  1. Injury of nail bed of finger of left hand, initial encounter     Plan: Based on his clinical examination today, it is difficult to discern the extent of the nailbed injury that was sustained.  We discussed conservative versus surgical treatment options.  From a conservative standpoint, given his bedside irrigation and repair in the emergency department setting, I explained  that we can allow for ongoing nail regrowth in this region.  However, without formalized inspection from an operation standpoint, I would be unable to fully determine the extent of the nailbed injury.  From a surgical standpoint, we discussed exploration and possible nailbed repair to be performed formally in the hand surgical setting.  I explained that with this technique, would be better able to delineate the extent of the nailbed injury in order to help manage expectations regarding the possibility of nail plate regrowth.  Risks and benefits of the procedure were discussed, risks including but not limited to infection, bleeding, scarring, stiffness, nerve injury, tendon injury, vascular injury, persistent nail deformity, recurrence of symptoms and need for subsequent operation.  We also discussed the appropriate postoperative protocol and timeframe for return to activities and function.  Patient expressed understanding.    Understanding his options, he would like to move forward with scheduling of left long finger exploration and nailbed repair.  Surgery can be performed in the office setting.  Will move forward with scheduling at this time.   Follow-up: No follow-ups on file.   Meds & Orders: No orders of the defined types were placed in this encounter.  No orders of the defined types were placed in this encounter.    Procedures: No procedures performed      Clinical History: No specialty comments available.  He reports that he quit smoking about 14 months ago. His smoking use included cigarettes. He started smoking about 20 months ago. He has a 1.3 pack-year smoking history. He has never been exposed to tobacco smoke. He has never used smokeless tobacco.  Recent Labs    04/29/23 1256  HGBA1C  6.3*    Objective:   Vital Signs: There were no vitals taken for this visit.  Physical Exam  Gen: Well-appearing, in no acute distress; non-toxic CV: Regular Rate. Well-perfused. Warm.  Resp:  Breathing unlabored on room air; no wheezing. Psych: Fluid speech in conversation; appropriate affect; normal thought process  Ortho Exam Left long finger: - Splint removed today, underlying nail nailbed demonstrates prior injury with application of aluminum stent underneath the eponychial fold, remains secured, appropriate color to the distal aspect of the digit with intact sensation, there is a small volar laceration with sutures in place as well over the distal pulp  Imaging: No results found.  Past Medical/Family/Surgical/Social History: Medications & Allergies reviewed per EMR, new medications updated. Patient Active Problem List   Diagnosis Date Noted   Prediabetes 05/07/2023   Insomnia 05/07/2023   Substance abuse (HCC) 05/07/2023   Former smoker 05/07/2023   Need for influenza vaccination 05/07/2023   Hospital discharge follow-up 05/07/2023   Dyslipidemia, goal LDL below 70 05/07/2023   Hypertensive urgency 04/29/2023   Uncontrolled hypertension 04/29/2023   AKI (acute kidney injury) (HCC) 04/29/2023   Hypokalemia 04/29/2023   Bronchitis 04/29/2023   Acute combined systolic and diastolic congestive heart failure (HCC) 04/28/2023   Major depressive disorder, single episode, severe (HCC) 11/04/2020   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   Acute upper respiratory infection 04/27/2011   Past Medical History:  Diagnosis Date   CHF (congestive heart failure) (HCC)    Hypertension    Family History  Problem Relation Age of Onset   Bipolar disorder Mother    High blood pressure Mother    Heart attack Father        heart attack at 61   Dementia Maternal Grandfather    High blood pressure Paternal Grandfather    Stroke Paternal Grandfather    Past Surgical History:  Procedure Laterality Date   APPENDECTOMY     Social History   Occupational History   Occupation: Social worker residential services  Tobacco Use   Smoking status: Former    Current packs/day: 0.00     Average packs/day: 0.3 packs/day for 5.0 years (1.3 ttl pk-yrs)    Types: Cigarettes    Start date: 2024    Quit date: 02/10/2023    Years since quitting: 1.1    Passive exposure: Never   Smokeless tobacco: Never  Vaping Use   Vaping status: Never Used  Substance and Sexual Activity   Alcohol use: Not Currently    Comment: social   Drug use: Not Currently    Types: Cocaine   Sexual activity: Yes    Birth control/protection: None    Corynn Solberg Afton Alderton, M.D.  [facility]

## 2024-04-21 NOTE — Progress Notes (Signed)
 Sent to front office. KH

## 2024-04-27 ENCOUNTER — Telehealth: Payer: Self-pay | Admitting: Orthopedic Surgery

## 2024-04-27 ENCOUNTER — Ambulatory Visit (INDEPENDENT_AMBULATORY_CARE_PROVIDER_SITE_OTHER): Admitting: Orthopedic Surgery

## 2024-04-27 ENCOUNTER — Other Ambulatory Visit: Payer: Self-pay | Admitting: Orthopedic Surgery

## 2024-04-27 DIAGNOSIS — S62663B Nondisplaced fracture of distal phalanx of left middle finger, initial encounter for open fracture: Secondary | ICD-10-CM

## 2024-04-27 DIAGNOSIS — S6992XA Unspecified injury of left wrist, hand and finger(s), initial encounter: Secondary | ICD-10-CM

## 2024-04-27 MED ORDER — OXYCODONE HCL 5 MG PO TABS: 5.0000 mg | ORAL_TABLET | Freq: Four times a day (QID) | ORAL | 0 refills | Status: AC | PRN

## 2024-04-27 NOTE — Telephone Encounter (Signed)
 Pt called stating he had surgery today with Ash and Dr Erwin was to send Oxycodone  in to [facility] at [address]. Please send ASAP. Pt phone number is [phone]. Please send pt mychart message letting him know meds have been sent.

## 2024-04-27 NOTE — Progress Notes (Signed)
 Procedure Note  Patient: Matthew Warren             Date of Birth: 15-Aug-1975           MRN: [id number]             Visit Date: 04/27/2024  Procedures: Visit Diagnoses: No diagnosis found.  NAME: Matthew Warren MEDICAL RECORD NO: [id number] DATE OF BIRTH: 02-Feb-1976 FACILITY: [facility] LOCATION: [facility] PHYSICIAN: GILDARDO ALDERTON, MD   OPERATIVE REPORT   DATE OF PROCEDURE: 04/27/24    PREOPERATIVE DIAGNOSIS: Left long finger nailbed injury with associated distal phalanx fracture   POSTOPERATIVE DIAGNOSIS: Left long finger nailbed injury with associated distal phalanx fracture   PROCEDURE: Left long finger nail plate removal, nailbed repair Left long finger open treatment of distal phalanx fracture, irrigation and debridement   SURGEON:  GILDARDO ALDERTON, M.D.   CHRISTOPER Almeda Rummer, [place]   ANESTHESIA:  Local   INTRAVENOUS FLUIDS:  Per anesthesia flow sheet.   ESTIMATED BLOOD LOSS:  Minimal.   COMPLICATIONS:  None.   SPECIMENS: None   TOURNIQUET TIME: Not utilized    DISPOSITION:  Stable to PACU.   INDICATIONS: 48 year old male who sustained a crush injury to the left long finger with a distal phalanx fracture and notable nailbed injury.  Patient was indicated for left long finger irrigation debridement, treatment of distal phalanx fracture closed versus open and nailbed repair.  Risks and benefits of surgery were discussed including the risks of infection, bleeding, scarring, stiffness, nerve injury, vascular injury, tendon injury, need for subsequent operation, lack of nail regrowth, persistent deformity, nonunion, malunion.  He voiced understanding of these risks and elected to proceed.  OPERATIVE COURSE: Patient was seen and identified in the preoperative area and marked appropriately.  Surgical consent had been signed.  He was transferred to the procedure room and placed in supine position with the left upper extremity on an arm board.  10 cc of  1% lidocaine  was utilized for digital block purposes of the left long finger.  Left upper extremity was prepped and draped in normal sterile orthopedic fashion.  A surgical pause was performed between the surgeons and procedure room staff and all were in agreement as to the patient, procedure, and site of procedure.  Tourniquet was not utilized for this case.  We first began with inspection of the underlying nailbed.  Notable laceration had been sustained to the radial border of the nailbed.  This was repaired atraumatically utilizing 5-0 chromic in simple standard fashion.  Copious irrigation was performed, the volar laceration sites were inspected.  Prior sutures were removed and irrigation was performed of the underlying distal phalanx fracture.  Devitalized tissue was sharply excised from the distal pulp.  Chromic sutures were utilized for closure of the skin surface over the distal phalanx to ensure no exposed underlying bone.  Given that the fracture was nondisplaced, this did not warrant internal fixation.  Laceration sites were once again irrigated.  Dermabond was also utilized for augmentation of the nail bed repair.  Sterile dressings were applied utilizing Xeroform, gauze and a finger splint for the distal phalanx.  This was well secured.  The operative drapes were broken down.  The patient was subsequently taken to the recovery area in stable condition.  Post-operative plan: The patient will recover and then be discharged home.  The patient will be non weight bearing on the left upper extremity in a finger splint for the long finger.   I will see  the patient back in the office in 1 week for postoperative followup.  Discharge instructions and pain medication was provided.  Jethro Radke, MD Electronically signed, 04/27/24

## 2024-05-04 ENCOUNTER — Encounter: Admitting: Orthopedic Surgery

## 2024-05-05 ENCOUNTER — Ambulatory Visit: Admitting: Orthopedic Surgery

## 2024-05-05 DIAGNOSIS — S6992XA Unspecified injury of left wrist, hand and finger(s), initial encounter: Secondary | ICD-10-CM

## 2024-05-05 DIAGNOSIS — S62663B Nondisplaced fracture of distal phalanx of left middle finger, initial encounter for open fracture: Secondary | ICD-10-CM

## 2024-05-05 NOTE — Progress Notes (Signed)
   Edu On - 48 y.o. male MRN [id number]  Date of birth: 10-05-1975  Office Visit Note: Visit Date: 05/05/2024 PCP: Paseda, Folashade R, FNP Referred by: Paseda, Folashade R, FNP  Subjective:  HPI: Matthew Warren is a 48 y.o. male who presents today for follow up 8 days status post Left long finger nail plate removal, nailbed repair and Left long finger open treatment of distal phalanx fracture, irrigation and debridement.  He is doing well overall, pain is controlled.  Pertinent ROS were reviewed with the patient and found to be negative unless otherwise specified above in HPI.   Assessment & Plan: Visit Diagnoses: No diagnosis found.  Plan: Doing well overall, demonstrates appropriate healing.  Continue with dressing changes daily.  Finger splint reapplied today.  Follow-up in approximately 1 week for recheck, likely suture removal at that time.  Follow-up: No follow-ups on file.   Meds & Orders: No orders of the defined types were placed in this encounter.  No orders of the defined types were placed in this encounter.    Procedures: No procedures performed       Objective:   Vital Signs: There were no vitals taken for this visit.  Ortho Exam Left long finger, nailbed repair remains intact, no erythema or drainage, mild swelling, sensation is intact distally, normal color throughout digit  Imaging: No results found.   Mackensey Bolte Afton Alderton, M.D. [facility], Hand Surgery

## 2024-05-12 ENCOUNTER — Ambulatory Visit: Admitting: Orthopedic Surgery

## 2024-05-12 DIAGNOSIS — S6992XA Unspecified injury of left wrist, hand and finger(s), initial encounter: Secondary | ICD-10-CM

## 2024-05-12 NOTE — Progress Notes (Signed)
   Matthew Warren - 48 y.o. male MRN [id number]  Date of birth: 25-Jun-1976  Office Visit Note: Visit Date: 05/12/2024 PCP: Paseda, Folashade R, FNP Referred by: Paseda, Folashade R, FNP  Subjective:  HPI: Matthew Warren is a 48 y.o. male who presents today for follow up 2 weeks status post left long finger nail plate removal, nailbed repair. Left long finger open treatment of distal phalanx fracture, irrigation and debridement.  He is doing well overall, pain is controlled.  Pertinent ROS were reviewed with the patient and found to be negative unless otherwise specified above in HPI.   Assessment & Plan: Visit Diagnoses:  1. Injury of nail bed of finger of left hand, initial encounter [S69.92XA]     Plan: He continues to do well postoperatively.  Sutures removed today from the volar aspect of the digit which had been placed in the emergency department setting.  I did explain that there was an element of shear injury to the distal pulp which will need time for granulation tissue to come in.  Also did once again reiterate the slow nature of nail regrowth, the nailbed repair does appear intact.  He can follow-up in approxi-1 month for wound check.  Follow-up: No follow-ups on file.   Meds & Orders: No orders of the defined types were placed in this encounter.  No orders of the defined types were placed in this encounter.    Procedures: No procedures performed       Objective:   Vital Signs: There were no vitals taken for this visit.  Ortho Exam Left long finger, nailbed repair remains intact, no erythema or drainage, mild swelling, sensation is intact distally, normal color throughout digit   Imaging: No results found.   Holy Battenfield Afton Alderton, M.D. [facility], Hand Surgery

## 2024-06-07 ENCOUNTER — Encounter: Admitting: Orthopedic Surgery

## 2024-06-08 ENCOUNTER — Other Ambulatory Visit: Payer: Self-pay | Admitting: Emergency Medicine

## 2024-06-09 ENCOUNTER — Other Ambulatory Visit: Payer: Self-pay | Admitting: Internal Medicine

## 2024-06-14 ENCOUNTER — Encounter: Payer: Self-pay | Admitting: Radiology

## 2024-07-13 ENCOUNTER — Other Ambulatory Visit: Payer: Self-pay | Admitting: Nurse Practitioner
